# Patient Record
Sex: Female | Born: 1985 | Race: Asian | Hispanic: No | Marital: Married | Smoking: Never smoker
Health system: Southern US, Community
[De-identification: ages and names within clinical notes are randomized; demographics above are authoritative.]

## PROBLEM LIST (undated history)

## (undated) DIAGNOSIS — O9902 Anemia complicating childbirth: Secondary | ICD-10-CM

## (undated) DIAGNOSIS — Z98891 History of uterine scar from previous surgery: Secondary | ICD-10-CM

## (undated) HISTORY — DX: Anemia complicating childbirth: O99.02

## (undated) HISTORY — DX: History of uterine scar from previous surgery: Z98.891

---

## 2016-08-02 ENCOUNTER — Telehealth: Payer: Self-pay | Admitting: *Deleted

## 2016-08-02 NOTE — Telephone Encounter (Signed)
PreVisit Call attempted. Left VM requesting return call. 

## 2016-08-03 ENCOUNTER — Encounter: Payer: Self-pay | Admitting: Family Medicine

## 2016-08-03 ENCOUNTER — Ambulatory Visit (INDEPENDENT_AMBULATORY_CARE_PROVIDER_SITE_OTHER): Payer: Managed Care, Other (non HMO) | Admitting: Family Medicine

## 2016-08-03 VITALS — BP 98/64 | HR 99 | Temp 98.2°F | Ht 64.0 in | Wt 96.2 lb

## 2016-08-03 DIAGNOSIS — G479 Sleep disorder, unspecified: Secondary | ICD-10-CM

## 2016-08-03 DIAGNOSIS — R05 Cough: Secondary | ICD-10-CM

## 2016-08-03 DIAGNOSIS — R059 Cough, unspecified: Secondary | ICD-10-CM

## 2016-08-03 MED ORDER — AZITHROMYCIN 250 MG PO TABS: ORAL_TABLET | ORAL | 0 refills | Status: DC

## 2016-08-03 MED ORDER — BENZONATATE 100 MG PO CAPS: 100.0000 mg | ORAL_CAPSULE | Freq: Three times a day (TID) | ORAL | 1 refills | Status: DC | PRN

## 2016-08-03 NOTE — Progress Notes (Signed)
Pre visit review using our clinic review tool, if applicable. No additional management support is needed unless otherwise documented below in the visit note. 

## 2016-08-03 NOTE — Progress Notes (Signed)
Julie Gonzalez is a 31 y.o. female is here to [facility].   History of Present Illness:   Insurance claims handler, CMA, acting as scribe for Dr. Earlene Plater.  Cough  This is a new problem. The current episode started in the past 7 days. The problem has been gradually worsening. The problem occurs every few minutes. The cough is non-productive. Pertinent negatives include no chest pain, chills, fever, headaches, rash or shortness of breath. She has tried nothing for the symptoms.  Insomnia  Primary symptoms: fragmented sleep.  The current episode started more than one year. The problem occurs nightly. The problem is unchanged. How many beverages per day that contain caffeine: 0 - 1.  Nothing relieves the symptoms. Past treatments include nothing. Typical bedtime:  10-11 P.M..  How long after going to bed to you fall asleep: over an hour.   Duration of naps:  One to two hours.  PMH includes: family stress or anxiety. Prior diagnostic workup includes:  No prior workup.   Health Maintenance Due  Topic Date Due  . HIV Screening  10/02/1998  . TETANUS/TDAP  10/02/2002  . PAP SMEAR  10/01/2004   PMHx, SurgHx, SocialHx, Medications, and Allergies were reviewed in the Visit Navigator and updated as appropriate.   No past medical history on file.  No past surgical history on file.  No family history on file.  Social History  Substance Use Topics  . Smoking status: Never Smoker  . Smokeless tobacco: Never Used  . Alcohol use No    Current Medications and Allergies:   No current outpatient prescriptions on file.  No Known Allergies   Review of Systems:   Review of Systems  Constitutional: Negative for chills and fever.  HENT: Positive for congestion.   Respiratory: Positive for cough. Negative for shortness of breath.   Cardiovascular: Negative for chest pain and palpitations.  Gastrointestinal: Negative for abdominal pain, nausea and vomiting.  Genitourinary: Negative for frequency.    Musculoskeletal: Negative for back pain.  Skin: Negative for rash.  Neurological: Negative for loss of consciousness and headaches.  Psychiatric/Behavioral: The patient has insomnia.     Vitals:   Vitals:   08/03/16 0843  BP: 98/64  Pulse: 99  Temp: 98.2 F (36.8 C)  TempSrc: Oral  SpO2: 100%  Weight: 96 lb 3.2 oz (43.6 kg)  Height: 5\' 4"  (1.626 m)     Body mass index is 16.51 kg/m.   Physical Exam:   Physical Exam  Constitutional: She appears well-developed and well-nourished. No distress.  HENT:  Head: Normocephalic and atraumatic.  Right Ear: Tympanic membrane and external ear normal.  Left Ear: Tympanic membrane and external ear normal.  Nose: Rhinorrhea present. Right sinus exhibits no maxillary sinus tenderness and no frontal sinus tenderness. Left sinus exhibits no maxillary sinus tenderness and no frontal sinus tenderness.  Eyes: Pupils are equal, round, and reactive to light.  Neck: Neck supple.  Cardiovascular: Normal rate, regular rhythm, normal heart sounds and intact distal pulses.   Pulmonary/Chest: Breath sounds normal.  Abdominal: Soft.  Lymphadenopathy:    She has no cervical adenopathy.  Neurological: She is alert.  Skin: Skin is warm. Capillary refill takes less than 2 seconds.  Psychiatric: She has a normal mood and affect. Her behavior is normal. Judgment and thought content normal.  Nursing note and vitals reviewed.    Assessment and Plan:   Julie Gonzalez was seen today for establish care and cough.  Diagnoses and all orders for this visit:  Cough Comments: URI with mild bronchitis. No red flags.  Orders: -     azithromycin (ZITHROMAX) 250 MG tablet; 2 po day, then 1 po each day after until done. (SAFETY NET TO HOLD) -     benzonatate (TESSALON PERLES) 100 MG capsule; Take 1 capsule (100 mg total) by mouth 3 (three) times daily as needed for cough.  Sleep disorder Comments: Discussed sleep hygiene. OTC Melatonin trial x 4-6  weeks.  . Reviewed expectations re: course of current medical issues. . Discussed self-management of symptoms. . Outlined signs and symptoms indicating need for more acute intervention. . Patient verbalized understanding and all questions were answered. . See orders for this visit as documented in the electronic medical record. . Patient received an After Visit Summary.  Records requested if needed. I spent 30 minutes with this patient, greater than 50% was face-to-face time counseling regarding the above diagnoses.  CMA served as Neurosurgeonscribe during this visit. History, Physical, and Plan performed by medical provider. Documentation and orders reviewed and attested to. Helane RimaErica Leondro Coryell, D.O.  Helane RimaErica Jakiah Goree, D.O. [place] 08/03/2016

## 2017-01-18 ENCOUNTER — Ambulatory Visit (INDEPENDENT_AMBULATORY_CARE_PROVIDER_SITE_OTHER): Payer: Managed Care, Other (non HMO) | Admitting: Family Medicine

## 2017-01-18 ENCOUNTER — Encounter: Payer: Self-pay | Admitting: Family Medicine

## 2017-01-18 VITALS — BP 110/70 | HR 129 | Temp 100.0°F | Ht 64.0 in | Wt 96.8 lb

## 2017-01-18 DIAGNOSIS — R05 Cough: Secondary | ICD-10-CM | POA: Diagnosis not present

## 2017-01-18 DIAGNOSIS — R6889 Other general symptoms and signs: Secondary | ICD-10-CM

## 2017-01-18 DIAGNOSIS — N390 Urinary tract infection, site not specified: Secondary | ICD-10-CM | POA: Diagnosis not present

## 2017-01-18 DIAGNOSIS — R300 Dysuria: Secondary | ICD-10-CM

## 2017-01-18 DIAGNOSIS — R509 Fever, unspecified: Secondary | ICD-10-CM

## 2017-01-18 LAB — POCT URINALYSIS DIPSTICK
Bilirubin, UA: NEGATIVE
Blood, UA: NEGATIVE
Glucose, UA: NEGATIVE
Nitrite, UA: POSITIVE
PH UA: 6 (ref 5.0–8.0)
PROTEIN UA: NEGATIVE
Spec Grav, UA: 1.015 (ref 1.010–1.025)
UROBILINOGEN UA: 0.2 U/dL

## 2017-01-18 LAB — POC INFLUENZA A&B (BINAX/QUICKVUE)
INFLUENZA B, POC: NEGATIVE
Influenza A, POC: NEGATIVE

## 2017-01-18 MED ORDER — CEFTRIAXONE SODIUM 1 G IJ SOLR
1.0000 g | Freq: Once | INTRAMUSCULAR | Status: AC
  Administered 2017-01-18: 1 g via INTRAMUSCULAR

## 2017-01-18 MED ORDER — CIPROFLOXACIN HCL 500 MG PO TABS: 500.0000 mg | ORAL_TABLET | Freq: Two times a day (BID) | ORAL | 0 refills | Status: DC

## 2017-01-18 NOTE — Progress Notes (Signed)
    Subjective:  Julie Gonzalez is a 31 y.o. female who presents today with a chief complaint of fever.  HPI:  Fever Symptoms started 2 days ago with runny nose and cough. Yesterday started having subjective fevers, chills, and myalgias. Son had similar symptoms with the runny nose. Took some cough and cold medication which helped some with the myalgias. Some headaches.   Patient reports one episode of dysuria yesterday evening. On further questioning, patient also reports a history of frequent UTIs  ROS: Per HPI  PMH: Smoking history reviewed. Never smoker.   Objective:  Physical Exam: BP 110/70   Pulse (!) 129 Comment: Manual/1 full minute  Temp 100 F (37.8 C) (Oral)   Ht 5\' 4"  (1.626 m)   Wt 96 lb 12.8 oz (43.9 kg)   SpO2 95%   BMI 16.62 kg/m   Gen: NAD, resting comfortably, nontoxic appearing HEENT: TMs clear bilaterally, Mild submandibular LAD. Maxillary sinuses transilluminate.  CV: RRR with no murmurs appreciated Pulm: NWOB, CTAB with no crackles, wheezes, or rhonchi Back: No significant CVA tenderness  Results for orders placed or performed in visit on 01/18/17 (from the past 72 hour(s))  POCT urinalysis dipstick     Status: Abnormal   Collection Time: 01/18/17 11:19 AM  Result Value Ref Range   Color, UA Yellow    Clarity, UA Cloudy    Glucose, UA Negative    Bilirubin, UA Negative    Ketones, UA 2+     Comment: 40   Spec Grav, UA 1.015 1.010 - 1.025   Blood, UA Negative    pH, UA 6.0 5.0 - 8.0   Protein, UA Negative    Urobilinogen, UA 0.2 0.2 or 1.0 E.U./dL   Nitrite, UA Positive    Leukocytes, UA Small (1+) (A) Negative  POC Influenza A&B(BINAX/QUICKVUE)     Status: Normal   Collection Time: 01/18/17 11:20 AM  Result Value Ref Range   Influenza A, POC Negative Negative   Influenza B, POC Negative Negative    Assessment/Plan:  Flu-like symptoms Patient with viral syndrome including rhinorrhea, cough, subjective fevers, and myalgias. Rapid flu  here today negative. It is unclear how much of her systemic symptoms are due to viral URI vs UTI, however will treat for presumed UTI (see below). Will proceed with supportive care for her URI. Encouraged good PO hydration and tylenol and ibuprofen as needed.  UTI Patient with positive UA, dysuria, and some systemic symptoms. She is tachycardic here, but with normal BP. Will give 1 gram of ceftriaxone in the office and start a 5 day course of cipro 500mg  bid to cover for pyelonephritis. She overall appears well. Discussed need for adequate oral hydration. Discussed strict return precautions including worsening of fevers, myalgias, fatigue, and lethargy. Will send for urine culture.   Katina Degreealeb M. Jimmey RalphParker, MD 01/18/2017 11:52 AM

## 2017-01-18 NOTE — Patient Instructions (Addendum)
Your flu test today is negative.  It does look like you may have a urinary tract infection. Please take ciprofloxacin 500mg  twice daily for 5 days.   You can take ibuprofen 600mg  three times a day as needed and tylenol 1000mg  three times a day as needed. You can take both of these at the same time.  Make sure you are taking in enough fluids.   Let us know if you are worsening or if symptoms do not improve within the next few days.  Take care,  Dr Jimmey RalphParker

## 2017-01-21 LAB — URINE CULTURE
MICRO NUMBER:: 80973175
SPECIMEN QUALITY:: ADEQUATE

## 2017-01-24 ENCOUNTER — Telehealth: Payer: Self-pay | Admitting: Family Medicine

## 2017-01-24 ENCOUNTER — Other Ambulatory Visit: Payer: Self-pay | Admitting: Family Medicine

## 2017-01-24 MED ORDER — SULFAMETHOXAZOLE-TRIMETHOPRIM 800-160 MG PO TABS: 1.0000 | ORAL_TABLET | Freq: Two times a day (BID) | ORAL | 0 refills | Status: DC

## 2017-01-24 NOTE — Telephone Encounter (Signed)
Patient's husband returning call to discuss lab results. Call [phone] and ask for [place].

## 2017-01-24 NOTE — Telephone Encounter (Signed)
Pt's Husband is aware of results.

## 2017-02-22 ENCOUNTER — Ambulatory Visit (INDEPENDENT_AMBULATORY_CARE_PROVIDER_SITE_OTHER): Payer: Managed Care, Other (non HMO) | Admitting: Family Medicine

## 2017-02-22 VITALS — BP 98/66 | HR 99 | Temp 98.6°F | Wt 99.2 lb

## 2017-02-22 DIAGNOSIS — F5102 Adjustment insomnia: Secondary | ICD-10-CM | POA: Diagnosis not present

## 2017-02-22 MED ORDER — LORAZEPAM 0.5 MG PO TABS: 0.5000 mg | ORAL_TABLET | Freq: Every day | ORAL | 0 refills | Status: DC

## 2017-02-26 ENCOUNTER — Encounter: Payer: Self-pay | Admitting: Family Medicine

## 2017-02-26 DIAGNOSIS — F5102 Adjustment insomnia: Secondary | ICD-10-CM | POA: Insufficient documentation

## 2017-02-26 NOTE — Progress Notes (Signed)
   Julie Gonzalez is a 31 y.o. female here for an acute visit.  History of Present Illness:   HPI:  Patient complains of trouble sleeping. Symptoms began several weeks ago. Patient describes the following psychological symptoms: stress in the family. Patient denies change in hair texture, cold intolerance, constipation, excessive menstrual bleeding, exercise intolerance, fever, GI blood loss, significant change in weight, symptoms of arthritis, unusual rashes and witnessed or suspected sleep apnea. Symptoms have gradually worsened. Symptom severity: struggles to carry out day to day responsibilities.. Previous visits for this problem: none. She feels that she practices good sleep hygiene.  PMHx, SurgHx, SocialHx, Medications, and Allergies were reviewed in the Visit Navigator and updated as appropriate.  Current Medications:   . None  No Known Allergies   Review of Systems:   Pertinent items are noted in the HPI. Otherwise, ROS is negative.  Vitals:   Vitals:   02/22/17 1043  BP: 98/66  Pulse: 99  Temp: 98.6 F (37 C)  TempSrc: Oral  SpO2: 97%  Weight: 99 lb 3.2 oz (45 kg)     Body mass index is 17.03 kg/m.   Physical Exam:   Physical Exam  Constitutional: She appears well-developed and well-nourished. No distress.  HENT:  Head: Normocephalic and atraumatic.  Eyes: Pupils are equal, round, and reactive to light. EOM are normal.  Neck: Normal range of motion. Neck supple.  Cardiovascular: Normal rate, regular rhythm, normal heart sounds and intact distal pulses.   Pulmonary/Chest: Effort normal.  Abdominal: Soft.  Skin: Skin is warm.  Psychiatric: She has a normal mood and affect. Her behavior is normal. Judgment and thought content normal.  Tired-appearing.   Nursing note and vitals reviewed.   Assessment and Plan:   Diagnoses and all orders for this visit:  Adjustment insomnia Comments: Discussed sleep hygiene measures including regular sleep schedule,  optimal sleep environment, and relaxing presleep rituals. Avoid daytime naps. Avoid caffeine after noon. Avoid excess alcohol. Avoid tobacco. Recommended daily exercise. Short course of benzodiazepine therapy. Follow up in 3 months or sooner if not improving. Orders: -     LORazepam (ATIVAN) 0.5 MG tablet; Take 1 tablet (0.5 mg total) by mouth at bedtime.  . Reviewed expectations re: course of current medical issues. . Discussed self-management of symptoms. . Outlined signs and symptoms indicating need for more acute intervention. . Patient verbalized understanding and all questions were answered. Marland Kitchen Health Maintenance issues including appropriate healthy diet, exercise, and smoking avoidance were discussed with patient. . See orders for this visit as documented in the electronic medical record. . Patient received an After Visit Summary.   Helane Rima, DO [place], [facility] 02/26/2017

## 2017-02-26 NOTE — Patient Instructions (Signed)
15 Tips to a Better Night's Sleep   Practice "good sleep hygiene". Here are some tips for you to try:   1. No reading or watching TV in bed. These are waking activities.  2. Go to bed when you're sleepy-tired, not when it's time to go to bed by habit.  3. Wind down during the second half of the evening before bedtime. Don't get involved in any kind of anxiety-provoking activities of thought 90 minutes before retiring to bed.  4. Do some breathing exercises or try to relax major muscle groups. Start at the toes and work up the body all the way to the forehead.  5. Your bed is for sleeping, so if you cannot sleep after 15-20 minutes in bed, get up and do something relaxing.  6. Have your room cool instead of warm.  7. Don't count sheep--counting is stimulating.  8. Exercise in the afternoon or early evening, but no later than three hours before bedtime.  9. Don't overeat or eat two to three hours before bedtime.  10. Try not to nap during the day.  11. If you awake in the middle of the night and can't get back to sleep within 30 minutes, get up and do something relaxing (no TV or reading anything stimulating).  12. Have no caffeine, alcohol or cigarettes two to three hours before retiring to bed.  13. If you have disturbing dreams or nightmares repeatedly, try to add an ending you enjoy or like better.  14. Keep a sleep journal. Thirty minutes before you go to bed, write down your concerns and hopes. It frees up your sleep from processing your dilemmas.  15. Listen to calming music or recorded sounds (ocean, forest, birds, crickets, brook) before bed.   If sleep problems persist, contact your physician or mental health professional. Let them know what is happening in your life. Your problem may have either organic or psychological contributors. Sleep disorders are considered chronic if they persist over more than one month.   

## 2017-03-14 ENCOUNTER — Encounter: Payer: Managed Care, Other (non HMO) | Admitting: Family Medicine

## 2017-03-17 ENCOUNTER — Encounter: Payer: Self-pay | Admitting: Family Medicine

## 2017-03-17 ENCOUNTER — Ambulatory Visit (INDEPENDENT_AMBULATORY_CARE_PROVIDER_SITE_OTHER): Payer: Managed Care, Other (non HMO) | Admitting: Family Medicine

## 2017-03-17 ENCOUNTER — Other Ambulatory Visit (HOSPITAL_COMMUNITY)
Admission: RE | Admit: 2017-03-17 | Discharge: 2017-03-17 | Disposition: A | Discharge status: Home / Self Care | Payer: Managed Care, Other (non HMO) | Source: Ambulatory Visit | Attending: Family Medicine | Admitting: Family Medicine

## 2017-03-17 VITALS — BP 110/70 | HR 88 | Temp 98.4°F | Ht 64.0 in | Wt 100.4 lb

## 2017-03-17 DIAGNOSIS — D500 Iron deficiency anemia secondary to blood loss (chronic): Secondary | ICD-10-CM

## 2017-03-17 DIAGNOSIS — Z13220 Encounter for screening for lipoid disorders: Secondary | ICD-10-CM

## 2017-03-17 DIAGNOSIS — Z124 Encounter for screening for malignant neoplasm of cervix: Secondary | ICD-10-CM | POA: Diagnosis not present

## 2017-03-17 DIAGNOSIS — R7989 Other specified abnormal findings of blood chemistry: Secondary | ICD-10-CM | POA: Diagnosis not present

## 2017-03-17 DIAGNOSIS — R5383 Other fatigue: Secondary | ICD-10-CM | POA: Diagnosis not present

## 2017-03-17 DIAGNOSIS — Z0000 Encounter for general adult medical examination without abnormal findings: Secondary | ICD-10-CM | POA: Diagnosis not present

## 2017-03-17 NOTE — Progress Notes (Signed)
Subjective:    Mahayla Haddaway is a 31 y.o. female and is here for a comprehensive physical exam.  Pertinent Gynecological History: No LMP recorded.  Health Maintenance Due  Topic Date Due  . HIV Screening  10/02/1998  . TETANUS/TDAP  10/02/2002  . PAP SMEAR  10/01/2004   PMHx, SurgHx, SocialHx, Medications, and Allergies were reviewed in the Visit Navigator and updated as appropriate.   Past Medical History:  Diagnosis Date  . H/O: cesarean section    History reviewed. No pertinent surgical history.  Family History  Problem Relation Age of Onset  . Diabetes Father   . Heart disease Paternal Grandmother    Social History  Substance Use Topics  . Smoking status: Never Smoker  . Smokeless tobacco: Never Used  . Alcohol use No    Review of Systems:   Pertinent items are noted in the HPI. Otherwise, ROS is negative.  Objective:   BP 110/70   Pulse 88   Temp 98.4 F (36.9 C) (Oral)   Ht 5\' 4"  (1.626 m)   Wt 100 lb 6.4 oz (45.5 kg)   SpO2 100%   BMI 17.23 kg/m   Wt Readings from Last 3 Encounters:  03/17/17 100 lb 6.4 oz (45.5 kg)  02/22/17 99 lb 3.2 oz (45 kg)  01/18/17 96 lb 12.8 oz (43.9 kg)     Ht Readings from Last 3 Encounters:  03/17/17 5\' 4"  (1.626 m)  01/18/17 5\' 4"  (1.626 m)  08/03/16 5\' 4"  (1.626 m)   General appearance: alert, cooperative and appears stated age. Head: normocephalic, without obvious abnormality, atraumatic. Neck: no adenopathy, supple, symmetrical, trachea midline; thyroid not enlarged, symmetric, no tenderness/mass/nodules. Lungs: clear to auscultation bilaterally. Heart: regular rate and rhythm Abdomen: soft, non-tender; no masses,  no organomegaly. Extremities: extremities normal, atraumatic, no cyanosis or edema. Skin: skin color, texture, turgor normal, no rashes or lesions. Lymph: cervical, supraclavicular, and axillary nodes normal; no abnormal inguinal nodes palpated. Neurologic: grossly normal.  Pelvic:  External  genitalia: no lesions.              Urethra: normal appearing urethra with no masses, tenderness or lesions.              Bartholins and Skenes: normal.               Vagina: normal appearing vagina with normal color and discharge, no lesions.              Cervix: normal appearance.              Pap and high risk HPV testing done: Yes.  .        Bimanual Exam:   Uterus: uterus is normal size, shape, consistency and nontender.                                      Adnexa: normal adnexa in size, nontender and no masses.                                   Assessment/Plan:   Diagnoses and all orders for this visit:  Routine physical examination -     CBC with Differential/Platelet -     Comprehensive metabolic panel  Pap smear for cervical cancer screening -     Cytology - PAP  Screening for  lipid disorders -     Lipid panel  Fatigue, unspecified type -     TSH -     Vitamin B12  Low vitamin D level -     VITAMIN D 25 Hydroxy (Vit-D Deficiency, Fractures)   Patient Counseling:   [x]     Nutrition: Stressed importance of moderation in sodium/caffeine intake, saturated fat and cholesterol, caloric balance, sufficient intake of fresh fruits, vegetables, fiber, calcium, iron, and 1 mg of folate supplement per day (for females capable of pregnancy).   [x]      Stressed the importance of regular exercise.    [x]     Substance Abuse: Discussed cessation/primary prevention of tobacco, alcohol, or other drug use; driving or other dangerous activities under the influence; availability of treatment for abuse.    [x]      Injury prevention: Discussed safety belts, safety helmets, smoke detector, smoking near bedding or upholstery.    [x]      Sexuality: Discussed sexually transmitted diseases, partner selection, use of condoms, avoidance of unintended pregnancy  and contraceptive alternatives.    [x]     Dental health: Discussed importance of regular tooth brushing, flossing, and dental  visits.   [x]      Health maintenance and immunizations reviewed. Please refer to Health maintenance section.   Helane RimaErica Grisela Mesch, DO [place] [facility]

## 2017-03-18 LAB — CBC WITH DIFFERENTIAL/PLATELET
Basophils Absolute: 31 cells/uL (ref 0–200)
Basophils Relative: 0.5 %
Eosinophils Absolute: 118 cells/uL (ref 15–500)
Eosinophils Relative: 1.9 %
HCT: 33.7 % — ABNORMAL LOW (ref 35.0–45.0)
Hemoglobin: 11.3 g/dL — ABNORMAL LOW (ref 11.7–15.5)
Lymphs Abs: 2108 cells/uL (ref 850–3900)
MCH: 27.3 pg (ref 27.0–33.0)
MCHC: 33.5 g/dL (ref 32.0–36.0)
MCV: 81.4 fL (ref 80.0–100.0)
MPV: 10.9 fL (ref 7.5–12.5)
Monocytes Relative: 9.9 %
Neutro Abs: 3329 cells/uL (ref 1500–7800)
Neutrophils Relative %: 53.7 %
Platelets: 189 10*3/uL (ref 140–400)
RBC: 4.14 10*6/uL (ref 3.80–5.10)
RDW: 12.8 % (ref 11.0–15.0)
Total Lymphocyte: 34 %
WBC mixed population: 614 cells/uL (ref 200–950)
WBC: 6.2 10*3/uL (ref 3.8–10.8)

## 2017-03-18 LAB — COMPREHENSIVE METABOLIC PANEL
AG Ratio: 1.4 (calc) (ref 1.0–2.5)
ALT: 10 U/L (ref 6–29)
AST: 14 U/L (ref 10–30)
Albumin: 4 g/dL (ref 3.6–5.1)
Alkaline phosphatase (APISO): 33 U/L (ref 33–115)
BUN: 9 mg/dL (ref 7–25)
CO2: 22 mmol/L (ref 20–32)
Calcium: 8.4 mg/dL — ABNORMAL LOW (ref 8.6–10.2)
Chloride: 108 mmol/L (ref 98–110)
Creat: 0.58 mg/dL (ref 0.50–1.10)
Globulin: 2.9 g/dL (calc) (ref 1.9–3.7)
Glucose, Bld: 74 mg/dL (ref 65–99)
Potassium: 4 mmol/L (ref 3.5–5.3)
Sodium: 140 mmol/L (ref 135–146)
Total Bilirubin: 0.3 mg/dL (ref 0.2–1.2)
Total Protein: 6.9 g/dL (ref 6.1–8.1)

## 2017-03-18 LAB — LIPID PANEL
Cholesterol: 168 mg/dL (ref <200)
HDL: 38 mg/dL — ABNORMAL LOW (ref >50)
LDL Cholesterol (Calc): 108 mg/dL (calc) — ABNORMAL HIGH
Non-HDL Cholesterol (Calc): 130 mg/dL (calc) — ABNORMAL HIGH (ref <130)
Total CHOL/HDL Ratio: 4.4 (calc) (ref <5.0)
Triglycerides: 112 mg/dL (ref <150)

## 2017-03-18 LAB — VITAMIN D 25 HYDROXY (VIT D DEFICIENCY, FRACTURES): Vit D, 25-Hydroxy: 10 ng/mL — ABNORMAL LOW (ref 30–100)

## 2017-03-18 LAB — TSH: TSH: 2.49 mIU/L

## 2017-03-18 LAB — VITAMIN B12: Vitamin B-12: 314 pg/mL (ref 200–1100)

## 2017-03-18 MED ORDER — CHOLECALCIFEROL 1.25 MG (50000 UT) PO TABS: ORAL_TABLET | ORAL | 0 refills | Status: DC

## 2017-03-18 MED ORDER — FERROUS SULFATE 325 (65 FE) MG PO TABS: 325.0000 mg | ORAL_TABLET | Freq: Two times a day (BID) | ORAL | 3 refills | Status: DC

## 2017-03-18 NOTE — Addendum Note (Signed)
Addended by: Helane RimaWALLACE, Corinthian Mizrahi R on: 03/18/2017 07:20 PM   Modules accepted: Orders

## 2017-03-21 LAB — CYTOLOGY - PAP: Diagnosis: NEGATIVE

## 2017-06-11 ENCOUNTER — Other Ambulatory Visit: Payer: Self-pay | Admitting: Family Medicine

## 2017-06-11 DIAGNOSIS — R7989 Other specified abnormal findings of blood chemistry: Secondary | ICD-10-CM

## 2017-08-15 LAB — OB RESULTS CONSOLE HIV ANTIBODY (ROUTINE TESTING): HIV: NONREACTIVE

## 2017-08-15 LAB — OB RESULTS CONSOLE RPR: RPR: NONREACTIVE

## 2017-08-15 LAB — OB RESULTS CONSOLE HEPATITIS B SURFACE ANTIGEN: Hepatitis B Surface Ag: NEGATIVE

## 2017-08-15 LAB — OB RESULTS CONSOLE RUBELLA ANTIBODY, IGM: Rubella: IMMUNE

## 2018-01-09 ENCOUNTER — Encounter (HOSPITAL_COMMUNITY): Payer: Self-pay

## 2018-01-09 ENCOUNTER — Inpatient Hospital Stay (HOSPITAL_COMMUNITY)
Admission: AD | Admit: 2018-01-09 | Discharge: 2018-01-13 | DRG: 788 | Disposition: A | Discharge status: Home / Self Care | Payer: Managed Care, Other (non HMO) | Attending: Obstetrics & Gynecology | Admitting: Obstetrics & Gynecology

## 2018-01-09 ENCOUNTER — Encounter (HOSPITAL_COMMUNITY)
Admission: AD | Disposition: A | Discharge status: Home / Self Care | Payer: Self-pay | Source: Home / Self Care | Attending: Obstetrics & Gynecology

## 2018-01-09 ENCOUNTER — Other Ambulatory Visit: Payer: Self-pay

## 2018-01-09 ENCOUNTER — Inpatient Hospital Stay (HOSPITAL_COMMUNITY)
Admit: 2018-01-09 | Discharge: 2018-01-09 | Disposition: A | Discharge status: Home / Self Care | Payer: Managed Care, Other (non HMO)

## 2018-01-09 ENCOUNTER — Inpatient Hospital Stay (HOSPITAL_BASED_OUTPATIENT_CLINIC_OR_DEPARTMENT_OTHER): Payer: Managed Care, Other (non HMO)

## 2018-01-09 DIAGNOSIS — O26843 Uterine size-date discrepancy, third trimester: Secondary | ICD-10-CM

## 2018-01-09 DIAGNOSIS — O9902 Anemia complicating childbirth: Secondary | ICD-10-CM | POA: Diagnosis present

## 2018-01-09 DIAGNOSIS — O34211 Maternal care for low transverse scar from previous cesarean delivery: Secondary | ICD-10-CM | POA: Diagnosis present

## 2018-01-09 DIAGNOSIS — Z3A35 35 weeks gestation of pregnancy: Secondary | ICD-10-CM | POA: Diagnosis not present

## 2018-01-09 DIAGNOSIS — D649 Anemia, unspecified: Secondary | ICD-10-CM | POA: Diagnosis present

## 2018-01-09 DIAGNOSIS — O9989 Other specified diseases and conditions complicating pregnancy, childbirth and the puerperium: Secondary | ICD-10-CM | POA: Diagnosis present

## 2018-01-09 DIAGNOSIS — O4290 Premature rupture of membranes, unspecified as to length of time between rupture and onset of labor, unspecified weeks of gestation: Secondary | ICD-10-CM | POA: Diagnosis present

## 2018-01-09 DIAGNOSIS — O34219 Maternal care for unspecified type scar from previous cesarean delivery: Secondary | ICD-10-CM

## 2018-01-09 DIAGNOSIS — R000 Tachycardia, unspecified: Secondary | ICD-10-CM | POA: Diagnosis present

## 2018-01-09 DIAGNOSIS — Z98891 History of uterine scar from previous surgery: Secondary | ICD-10-CM

## 2018-01-09 DIAGNOSIS — O42913 Preterm premature rupture of membranes, unspecified as to length of time between rupture and onset of labor, third trimester: Principal | ICD-10-CM | POA: Diagnosis present

## 2018-01-09 LAB — CBC
HCT: 34.2 % — ABNORMAL LOW (ref 36.0–46.0)
Hemoglobin: 11.5 g/dL — ABNORMAL LOW (ref 12.0–15.0)
MCH: 30.2 pg (ref 26.0–34.0)
MCHC: 33.6 g/dL (ref 30.0–36.0)
MCV: 89.8 fL (ref 78.0–100.0)
PLATELETS: 154 10*3/uL (ref 150–400)
RBC: 3.81 MIL/uL — AB (ref 3.87–5.11)
RDW: 15 % (ref 11.5–15.5)
WBC: 8.4 10*3/uL (ref 4.0–10.5)

## 2018-01-09 LAB — TYPE AND SCREEN
ABO/RH(D): O POS
Antibody Screen: NEGATIVE

## 2018-01-09 LAB — RPR: RPR: NONREACTIVE

## 2018-01-09 LAB — ABO/RH: ABO/RH(D): O POS

## 2018-01-09 LAB — AMNISURE RUPTURE OF MEMBRANE (ROM) NOT AT ARMC: AMNISURE: POSITIVE

## 2018-01-09 IMAGING — US US MFM OB COMP +14 WKS
1 series · 14 of 28 positions shown · non-contrast
Comparison: none

[Series 1: us mfm ob comp +14 wks · 14 of 95 slices shown]
[im 4/95]
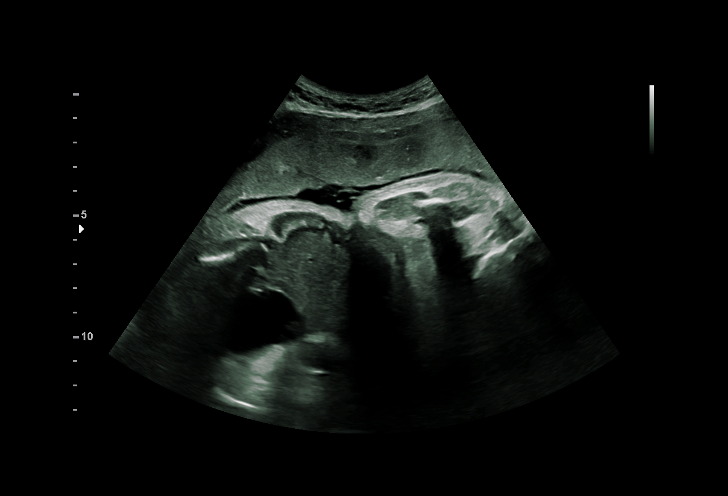
[im 11/95]
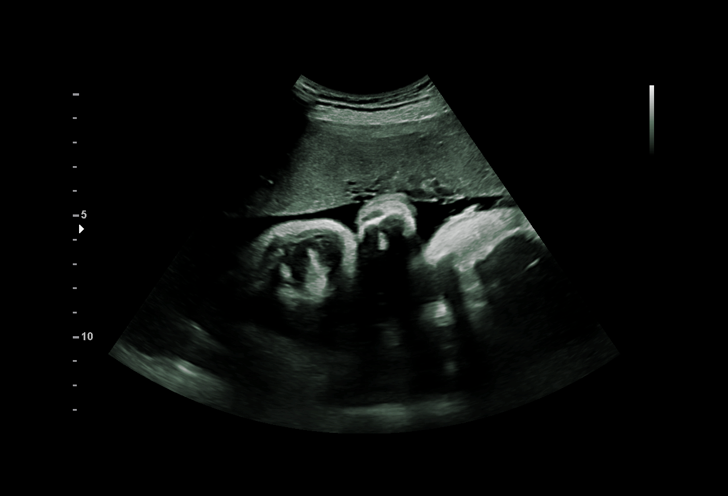
[im 18/95]
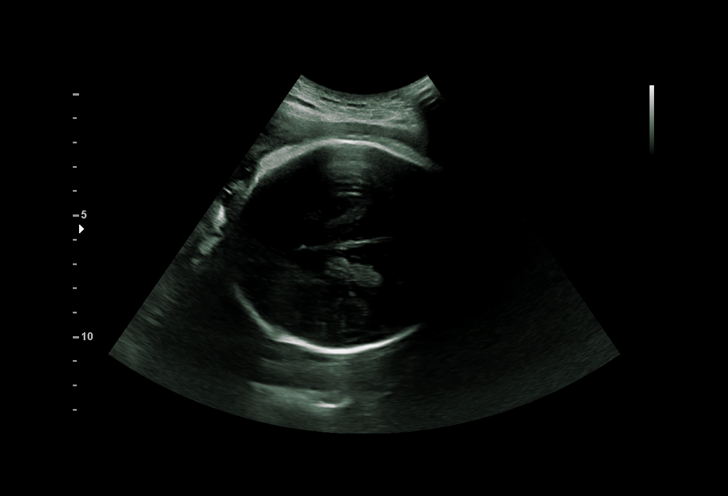
[im 25/95]
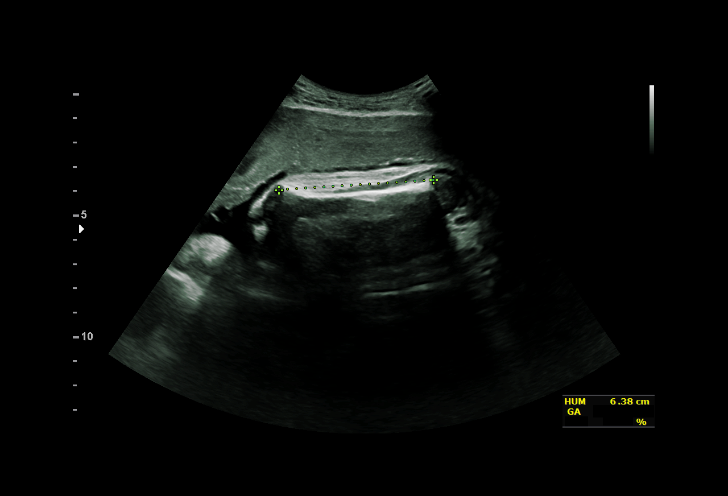
[im 32/95]
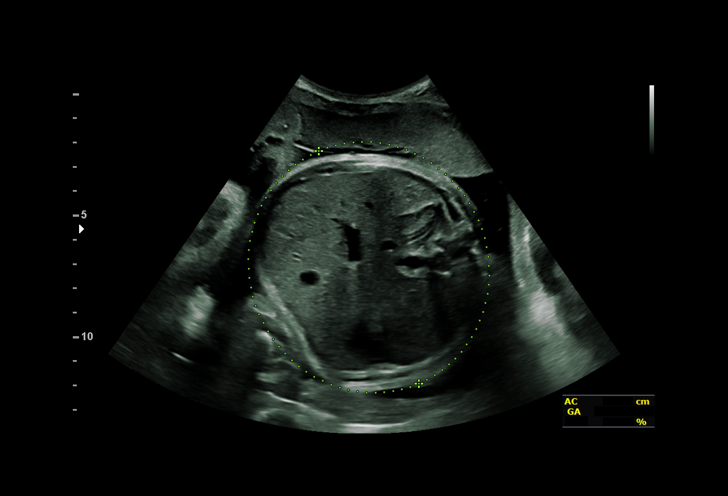
[im 39/95]
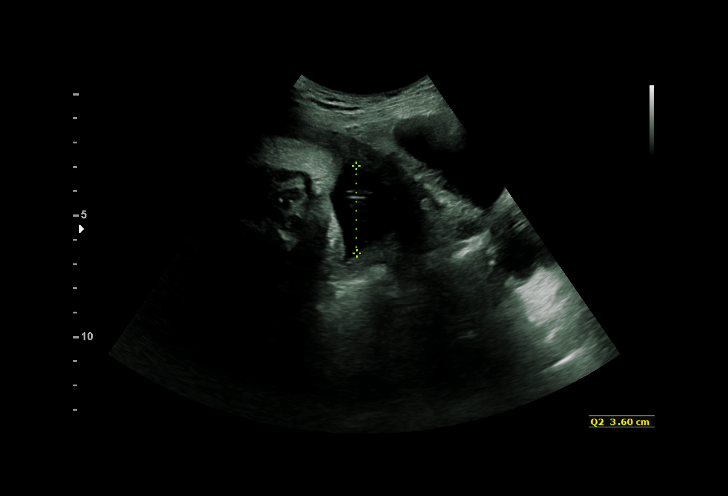
[im 46/95]
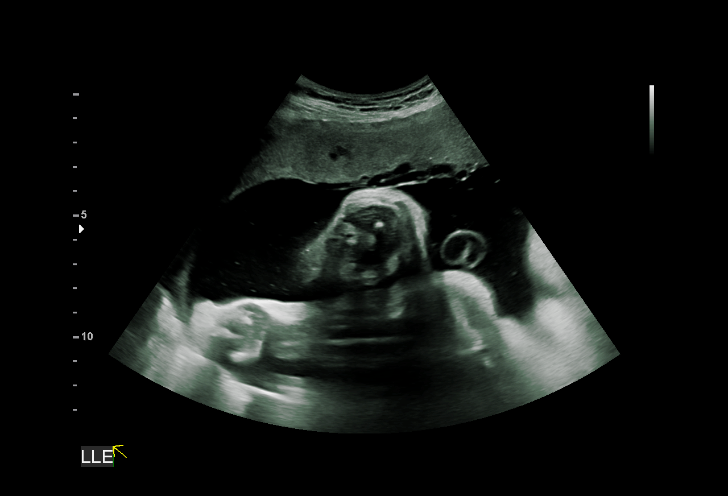
[im 53/95]
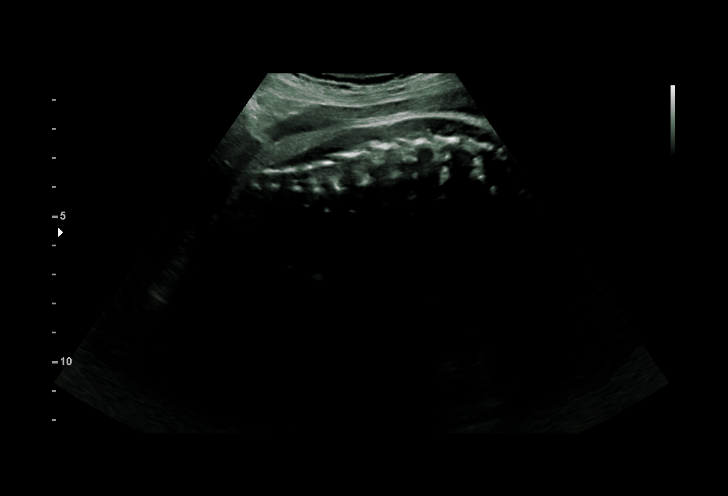
[im 60/95]
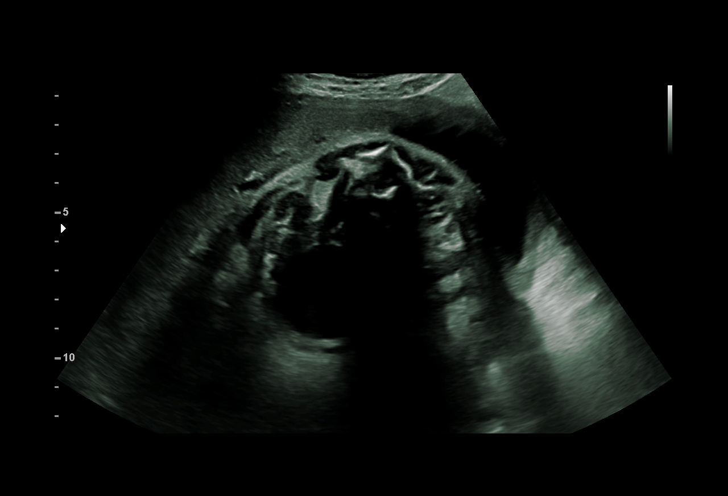
[im 67/95]
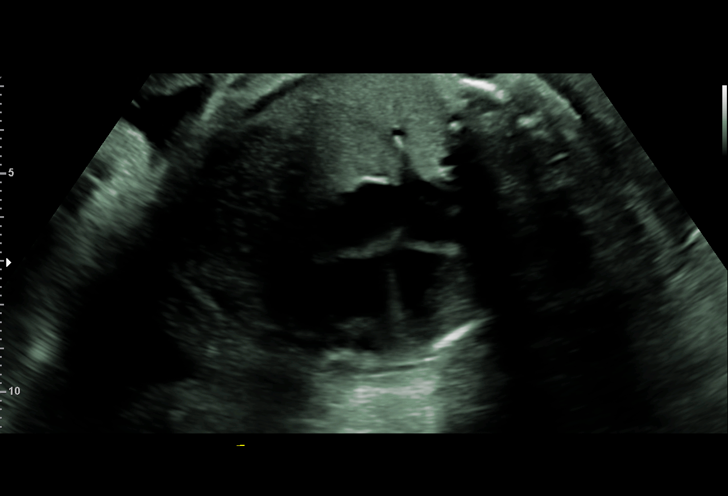
[im 74/95]
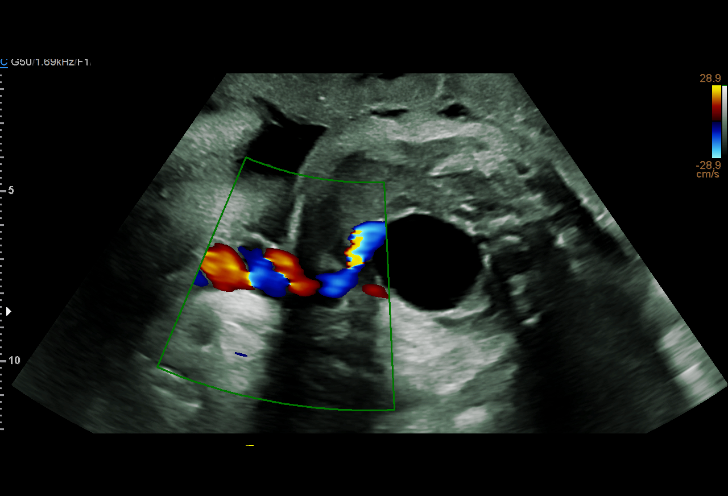
[im 81/95]
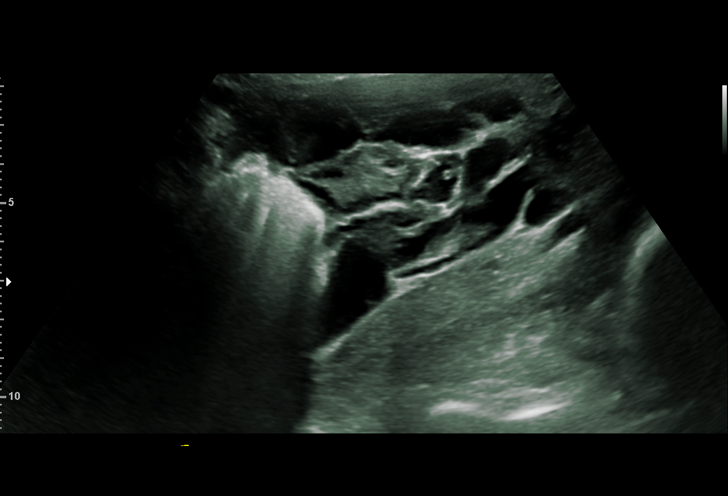
[im 88/95]
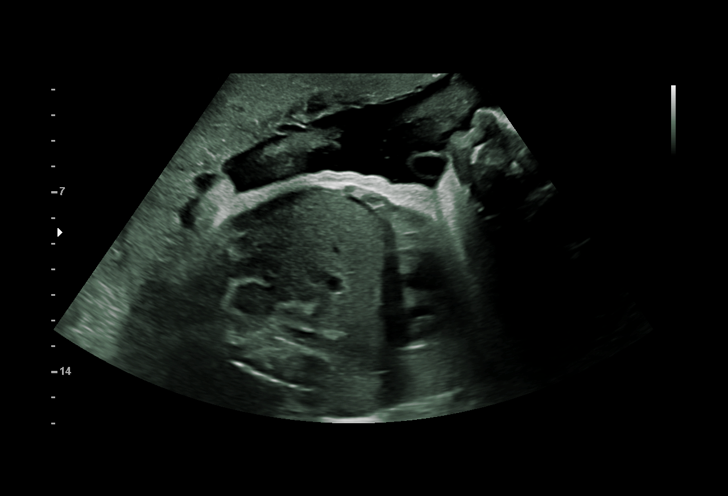
[im 95/95]
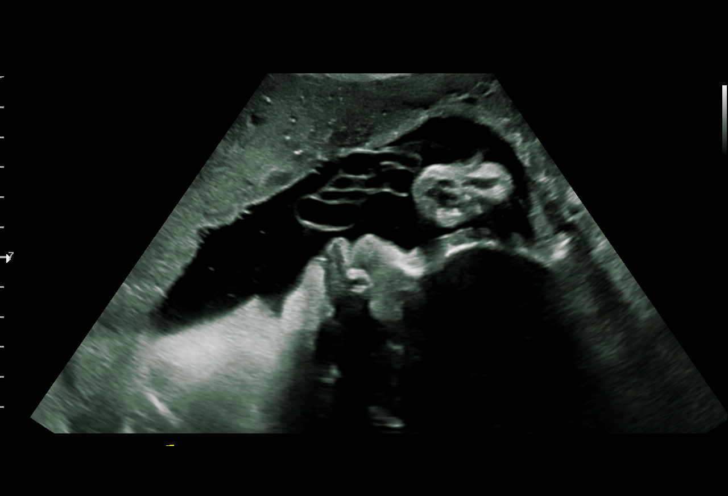

[14 of 28 positions shown; findings below may reference images not displayed]

Attending:        JUMPER      Address:          [HOSPITAL]
[facility]
MAU/Triage

J[facility]

Indications

Premature rupture of membranes - leaking       [YL]
fluid
History of cesarean delivery, currently        [YL]
pregnant
35 weeks gestation of pregnancy
Uterine size-date discrepancy, third trimester [YL]
Fetal Evaluation

Num Of Fetuses:         1
Fetal Heart Rate(bpm):  137
Cardiac Activity:       Observed
Presentation:           Cephalic
Placenta:               Anterior

Amniotic Fluid
AFI FV:      Within normal limits

AFI Sum(cm)     %Tile       Largest Pocket(cm)
19.21           72

RUQ(cm)       RLQ(cm)       LUQ(cm)        LLQ(cm)
4.02
Biometry

BPD:      86.3  mm     G. Age:  34w 6d         33  %    CI:        71.53   %    70 - 86
FL/HC:      22.0   %    20.1 -
HC:      324.9  mm     G. Age:  36w 6d         48  %    HC/AC:      1.03        0.93 -
AC:      316.2  mm     G. Age:  35w 4d         58  %    FL/BPD:     82.7   %    71 - 87
FL:       71.4  mm     G. Age:  36w 4d         70  %    FL/AC:      22.6   %    20 - 24
HUM:      63.4  mm     G. Age:  36w 6d         87  %
CER:        46  mm     G. Age:  N/A            90  %

LV:        6.2  mm

Est. FW:    [YL]  gm      6 lb 2 oz     70  %
OB History

Gravidity:    2         Term:   1
Living:       1
Gestational Age

Clinical EDD:  35w 4d                                        EDD:   [DATE]
U/S Today:     36w 0d                                        EDD:   [DATE]
Best:          35w 4d     Det. By:  Clinical EDD             EDD:   [DATE]
Anatomy

Cranium:               Appears normal         Aortic Arch:            Not well visualized
Cavum:                 Not well visualized    Ductal Arch:            Appears normal
Ventricles:            Appears normal         Diaphragm:              Appears normal
Choroid Plexus:        Appears normal         Stomach:                Appears normal, left
sided
Cerebellum:            Appears normal         Abdomen:                Appears normal
Posterior Fossa:       Appears normal         Abdominal Wall:         Appears nml (cord
insert, abd wall)
Nuchal Fold:           Not applicable (>20    Cord Vessels:           Appears normal (3
wks GA)                                        vessel cord)
Face:                  Appears normal         Kidneys:                Appear normal
(orbits and profile)
Lips:                  Appears normal         Bladder:                Appears normal
Thoracic:              Appears normal         Spine:                  Appears normal
Heart:                 Appears normal         Upper Extremities:      Appears normal
(4CH, axis, and
situs)
RVOT:                  Appears normal         Lower Extremities:      Appears normal
LVOT:                  Appears normal

Other:  Male gender. Technically difficult due to advanced GA and fetal
position.
Cervix Uterus Adnexa

Cervix
Not visualized (advanced GA >[YL])

Left Ovary
Not visualized.

Right Ovary
Within normal limits.
Comments

U/S images reviewed.  Appropriate fetal growth is noted.  No
fetal abnormalities are seen.
Recommendations: 1) Follow-up as clinically indicated
Recommendations

Follow-up as clinically indicated

## 2018-01-09 SURGERY — Surgical Case
Anesthesia: Regional

## 2018-01-09 MED ORDER — AMOXICILLIN 500 MG PO CAPS
500.0000 mg | ORAL_CAPSULE | Freq: Three times a day (TID) | ORAL | Status: DC
  Filled 2018-01-09 (×2): qty 1

## 2018-01-09 MED ORDER — AZITHROMYCIN 500 MG PO TABS
500.0000 mg | ORAL_TABLET | Freq: Every day | ORAL | Status: DC
  Filled 2018-01-09: qty 1

## 2018-01-09 MED ORDER — ZOLPIDEM TARTRATE 5 MG PO TABS: 5.0000 mg | ORAL_TABLET | Freq: Every evening | ORAL | Status: DC | PRN

## 2018-01-09 MED ORDER — PRENATAL MULTIVITAMIN CH
1.0000 | ORAL_TABLET | Freq: Every day | ORAL | Status: DC
  Administered 2018-01-09: 1 via ORAL
  Filled 2018-01-09 (×2): qty 1

## 2018-01-09 MED ORDER — NIFEDIPINE 10 MG PO CAPS
10.0000 mg | ORAL_CAPSULE | Freq: Four times a day (QID) | ORAL | Status: DC
  Administered 2018-01-09 (×3): 10 mg via ORAL
  Filled 2018-01-09 (×6): qty 1

## 2018-01-09 MED ORDER — SODIUM CHLORIDE 0.9 % IV SOLN
500.0000 mg | INTRAVENOUS | Status: AC
  Administered 2018-01-09 – 2018-01-10 (×2): 500 mg via INTRAVENOUS
  Filled 2018-01-09 (×2): qty 500

## 2018-01-09 MED ORDER — SODIUM CHLORIDE 0.9 % IV SOLN
2.0000 g | Freq: Four times a day (QID) | INTRAVENOUS | Status: DC
  Administered 2018-01-09 – 2018-01-10 (×7): 2 g via INTRAVENOUS
  Filled 2018-01-09: qty 2
  Filled 2018-01-09: qty 2000
  Filled 2018-01-09: qty 2
  Filled 2018-01-09: qty 2000
  Filled 2018-01-09: qty 2
  Filled 2018-01-09: qty 2000
  Filled 2018-01-09 (×2): qty 2

## 2018-01-09 MED ORDER — NIFEDIPINE 10 MG PO CAPS
20.0000 mg | ORAL_CAPSULE | Freq: Once | ORAL | Status: AC
  Administered 2018-01-09: 20 mg via ORAL
  Filled 2018-01-09: qty 2

## 2018-01-09 MED ORDER — BETAMETHASONE SOD PHOS & ACET 6 (3-3) MG/ML IJ SUSP
12.0000 mg | INTRAMUSCULAR | Status: AC
  Administered 2018-01-09 – 2018-01-10 (×2): 12 mg via INTRAMUSCULAR
  Filled 2018-01-09 (×2): qty 2

## 2018-01-09 MED ORDER — ACETAMINOPHEN 325 MG PO TABS: 650.0000 mg | ORAL_TABLET | ORAL | Status: DC | PRN

## 2018-01-09 MED ORDER — DOCUSATE SODIUM 100 MG PO CAPS
100.0000 mg | ORAL_CAPSULE | Freq: Every day | ORAL | Status: DC
  Administered 2018-01-09 – 2018-01-10 (×2): 100 mg via ORAL
  Filled 2018-01-09 (×3): qty 1

## 2018-01-09 MED ORDER — LACTATED RINGERS IV SOLN
INTRAVENOUS | Status: DC
  Administered 2018-01-09 – 2018-01-10 (×3): via INTRAVENOUS

## 2018-01-09 MED ORDER — CALCIUM CARBONATE ANTACID 500 MG PO CHEW: 2.0000 | CHEWABLE_TABLET | ORAL | Status: DC | PRN

## 2018-01-09 NOTE — MAU Note (Signed)
Pt here with c/o rupture of membranes about an hour ago. Denies any problems with the pregnancy. Reports fetal movement.

## 2018-01-09 NOTE — Progress Notes (Addendum)
Patient ID: Julie Gonzalez, female   DOB: 07-26-1985, 32 y.o.   MRN: [id number]  Patient seen and was d/w Dr Cherly Hensenousins this AM.  [name], clear fluid, well dated 35.4 weeks, prior 37 wk C/s for breech in labor. [facility] and wants RC[facility]s when appropriate. No s/s of labor/ chorioamnionitis/ abruption. FHT- catergory I. VS stable, uterus non tender.  Plan repeat C/section tomorrow, will be >36 hrs from 1st BTMZ dose, try to proceed before infection and before labor but intervene sooner for maternal/ fetal reasons.  Sono- 6'2" at 72%, AFI normal. Vx. Placenta anterior but not low.  Will cont Amp/Azithro and complete 2nd BTMZ dose. Change monitoring to 1 hr every shift and continuous if pain/ bleeding.  NPO at 8 am tomorrow for RC/s at 5 pm on 01/10/18.  Reviewed TL options, not sure. Will plan PP Paragard IUI and interval sterilization in few yrs   Risks/complications of surgery reviewed incl infection, bleeding, damage to internal organs including bladder, bowels, ureters, blood vessels, other risks from anesthesia, VTE and delayed complications of any surgery, complications in future surgery reviewed. Also discussed neonatal complications incl difficult delivery, laceration, vacuum assistance, TTN etc. Pt understands and agrees, all concerns addressed.    V.Zakry Caso, MD

## 2018-01-09 NOTE — H&P (Signed)
Julie Gonzalez is a 32 y.o. female presenting @  7735 4/7 weeks with SROM clear fluid. Previous LTCS for breech. Per note, single layer closure of C/S with desire for TOLAC. (+) FM  OB History    Gravida  2   Para  1   Term  1   Preterm      AB      Living        SAB      TAB      Ectopic      Multiple      Live Births             Past Medical History:  Diagnosis Date  . H/O: cesarean section   . Medical history non-contributory    Past Surgical History:  Procedure Laterality Date  . CESAREAN SECTION     Family History: family history includes Diabetes in her father; Heart disease in her paternal grandmother. Social History:  reports that she has never smoked. She has never used smokeless tobacco. She reports that she does not drink alcohol or use drugs.     Maternal Diabetes: No Genetic Screening: Abnormal:  Results: Elevated risk of Trisomy 21 nl panorama Maternal Ultrasounds/Referrals: Normal Fetal Ultrasounds or other Referrals:  None Maternal Substance Abuse:  No Significant Maternal Medications:  None Significant Maternal Lab Results:  None Other Comments:  None  Review of Systems  All other systems reviewed and are negative.  History Dilation: Closed Effacement (%): Thick Exam by:: Dr. Cherly Hensenousins Blood pressure 103/69, pulse 93, temperature 98.5 F (36.9 C), temperature source Oral, resp. rate 18, height 5\' 4"  (1.626 m), weight 60.3 kg, last menstrual period 01/20/2017, SpO2 100 %. Exam Physical Exam  Constitutional: She is oriented to person, place, and time.  GI:  Gravid soft FH 32 cm  Musculoskeletal: She exhibits no edema.  Neurological: She is alert and oriented to person, place, and time.  Skin: Skin is warm and dry.  Psychiatric: She has a normal mood and affect.    Prenatal labs: ABO, Rh: --/--/O POS (08/27 0440) Antibody: NEG (08/27 0440) Rubella: Immune (04/02 0000) RPR: Nonreactive (04/02 0000)  HBsAg: Negative (04/02 0000)   HIV: Non-reactive (04/02 0000)  GBS:   pending  Assessment/Plan [name] Previous C/S IUP@ 35 4/7 weeks P) admit. Routine labs. BMZ x 2. [name] antibiotics while awaiting completion of BMZ. Pt now request repeat C/S for mode of delivery,  NICU on high census. Case disc with Neonatologist  Julie Gonzalez 01/09/2018, 6:58 AM

## 2018-01-09 NOTE — MAU Provider Note (Signed)
History     Chief Complaint  Patient presents with  . Rupture of Membranes   32 yo G2P1 Asian female @ 8735 4/[redacted] weeks gestation presents with c/o waking up with underwear completely wet. Pt had no further leakage thereafter. Hx notable for previous C/S. GBS cx not done as yet. (+) FM. Per prenatal records, single closure of uterine incision with pt's plan for TOLAC  OB History    Gravida  2   Para  1   Term  1   Preterm      AB      Living        SAB      TAB      Ectopic      Multiple      Live Births              Past Medical History:  Diagnosis Date  . H/O: cesarean section     Past Surgical History:  Procedure Laterality Date  . CESAREAN SECTION      Family History  Problem Relation Age of Onset  . Diabetes Father   . Heart disease Paternal Grandmother     Social History   Tobacco Use  . Smoking status: Never Smoker  . Smokeless tobacco: Never Used  Substance Use Topics  . Alcohol use: No  . Drug use: No    Allergies: No Known Allergies  Medications Prior to Admission  Medication Sig Dispense Refill Last Dose  . Cholecalciferol 50000 units TABS 50,000 units PO qwk for 12 weeks. 12 tablet 0   . ferrous sulfate 325 (65 FE) MG tablet Take 1 tablet (325 mg total) by mouth 2 (two) times daily with a meal. 60 tablet 3   . LORazepam (ATIVAN) 0.5 MG tablet Take 1 tablet (0.5 mg total) by mouth at bedtime. 30 tablet 0 Taking     Physical Exam   Blood pressure 103/68, pulse 92, temperature 98.5 F (36.9 C), resp. rate 17, last menstrual period 01/20/2017.  General appearance: alert, cooperative and no distress Abdomen: gravid FH 32 cm Pelvic: cervix normal in appearance, external genitalia normal and mucoid clear fluid noted. cervix/ closed/50/-3 posterior. fern x 2 done. amnio sure done Extremities: no edema, redness or tenderness in the calves or thighs ED Course  Leaking fluid IUP@ 35 4/7 weeks Previous C/S P) await amni sure. Will  need sonogram for S<D  MDM   Serita KyleSheronette A Livvy Spilman, MD 3:34 AM 01/09/2018

## 2018-01-09 NOTE — Plan of Care (Signed)
  Problem: Education: Goal: Knowledge of disease or condition will improve Outcome: Progressing   

## 2018-01-09 NOTE — Progress Notes (Signed)
Spoke with pt and husband regarding care. Dr Juliene PinaMody who is her primary OB provider as been advised of admit and care as well as desire for rpt C/S

## 2018-01-10 ENCOUNTER — Encounter (HOSPITAL_COMMUNITY)
Admission: AD | Disposition: A | Discharge status: Home / Self Care | Payer: Self-pay | Source: Home / Self Care | Attending: Obstetrics & Gynecology

## 2018-01-10 ENCOUNTER — Inpatient Hospital Stay (HOSPITAL_COMMUNITY): Payer: Managed Care, Other (non HMO) | Admitting: Anesthesiology

## 2018-01-10 ENCOUNTER — Encounter (HOSPITAL_COMMUNITY): Payer: Self-pay | Admitting: Obstetrics & Gynecology

## 2018-01-10 DIAGNOSIS — Z98891 History of uterine scar from previous surgery: Secondary | ICD-10-CM

## 2018-01-10 HISTORY — DX: History of uterine scar from previous surgery: Z98.891

## 2018-01-10 LAB — CBC
HCT: 31.6 % — ABNORMAL LOW (ref 36.0–46.0)
HEMOGLOBIN: 10.6 g/dL — AB (ref 12.0–15.0)
MCH: 30.6 pg (ref 26.0–34.0)
MCHC: 33.5 g/dL (ref 30.0–36.0)
MCV: 91.3 fL (ref 78.0–100.0)
Platelets: 142 10*3/uL — ABNORMAL LOW (ref 150–400)
RBC: 3.46 MIL/uL — AB (ref 3.87–5.11)
RDW: 15.2 % (ref 11.5–15.5)
WBC: 10 10*3/uL (ref 4.0–10.5)

## 2018-01-10 SURGERY — Surgical Case
Anesthesia: Regional

## 2018-01-10 MED ORDER — ONDANSETRON HCL 4 MG/2ML IJ SOLN
INTRAMUSCULAR | Status: DC | PRN
  Administered 2018-01-10: 4 mg via INTRAVENOUS

## 2018-01-10 MED ORDER — PRENATAL MULTIVITAMIN CH
1.0000 | ORAL_TABLET | Freq: Every day | ORAL | Status: DC
  Administered 2018-01-11 – 2018-01-13 (×3): 1 via ORAL
  Filled 2018-01-10 (×3): qty 1

## 2018-01-10 MED ORDER — SIMETHICONE 80 MG PO CHEW
80.0000 mg | CHEWABLE_TABLET | ORAL | Status: DC
  Administered 2018-01-10 – 2018-01-13 (×3): 80 mg via ORAL
  Filled 2018-01-10 (×3): qty 1

## 2018-01-10 MED ORDER — ONDANSETRON HCL 4 MG/2ML IJ SOLN
INTRAMUSCULAR | Status: AC
  Filled 2018-01-10: qty 2

## 2018-01-10 MED ORDER — OXYCODONE HCL 5 MG PO TABS: 5.0000 mg | ORAL_TABLET | Freq: Four times a day (QID) | ORAL | Status: DC | PRN

## 2018-01-10 MED ORDER — PHENYLEPHRINE 8 MG IN D5W 100 ML (0.08MG/ML) PREMIX OPTIME
INJECTION | INTRAVENOUS | Status: DC | PRN
  Administered 2018-01-10: 60 ug/min via INTRAVENOUS

## 2018-01-10 MED ORDER — KETOROLAC TROMETHAMINE 30 MG/ML IJ SOLN
INTRAMUSCULAR | Status: AC
  Filled 2018-01-10: qty 1

## 2018-01-10 MED ORDER — FENTANYL CITRATE (PF) 100 MCG/2ML IJ SOLN
INTRAMUSCULAR | Status: AC
  Filled 2018-01-10: qty 2

## 2018-01-10 MED ORDER — SODIUM CHLORIDE 0.9 % IR SOLN
Status: DC | PRN
  Administered 2018-01-10: 1

## 2018-01-10 MED ORDER — MORPHINE SULFATE (PF) 0.5 MG/ML IJ SOLN
INTRAMUSCULAR | Status: AC
  Filled 2018-01-10: qty 10

## 2018-01-10 MED ORDER — IBUPROFEN 600 MG PO TABS
600.0000 mg | ORAL_TABLET | Freq: Four times a day (QID) | ORAL | Status: DC
  Administered 2018-01-11 – 2018-01-13 (×11): 600 mg via ORAL
  Filled 2018-01-10 (×12): qty 1

## 2018-01-10 MED ORDER — DEXAMETHASONE SODIUM PHOSPHATE 10 MG/ML IJ SOLN
INTRAMUSCULAR | Status: DC | PRN
  Administered 2018-01-10: 5 mg via INTRAVENOUS

## 2018-01-10 MED ORDER — DIPHENHYDRAMINE HCL 25 MG PO CAPS: 25.0000 mg | ORAL_CAPSULE | Freq: Four times a day (QID) | ORAL | Status: DC | PRN

## 2018-01-10 MED ORDER — COCONUT OIL OIL
1.0000 "application " | TOPICAL_OIL | Status: DC | PRN
  Administered 2018-01-13: 1 via TOPICAL
  Filled 2018-01-10: qty 120

## 2018-01-10 MED ORDER — CEFAZOLIN SODIUM-DEXTROSE 2-4 GM/100ML-% IV SOLN
2.0000 g | INTRAVENOUS | Status: AC
  Administered 2018-01-10: 2 g via INTRAVENOUS
  Filled 2018-01-10: qty 100

## 2018-01-10 MED ORDER — OXYTOCIN 10 UNIT/ML IJ SOLN
INTRAMUSCULAR | Status: AC
  Filled 2018-01-10: qty 4

## 2018-01-10 MED ORDER — DEXAMETHASONE SODIUM PHOSPHATE 10 MG/ML IJ SOLN
INTRAMUSCULAR | Status: AC
  Filled 2018-01-10: qty 1

## 2018-01-10 MED ORDER — MENTHOL 3 MG MT LOZG: 1.0000 | LOZENGE | OROMUCOSAL | Status: DC | PRN

## 2018-01-10 MED ORDER — MORPHINE SULFATE (PF) 0.5 MG/ML IJ SOLN
INTRAMUSCULAR | Status: DC | PRN
  Administered 2018-01-10: .15 mg via INTRATHECAL

## 2018-01-10 MED ORDER — DIPHENHYDRAMINE HCL 50 MG/ML IJ SOLN
12.5000 mg | Freq: Once | INTRAMUSCULAR | Status: AC
  Administered 2018-01-10: 12.5 mg via INTRAVENOUS

## 2018-01-10 MED ORDER — SOD CITRATE-CITRIC ACID 500-334 MG/5ML PO SOLN
30.0000 mL | Freq: Once | ORAL | Status: AC
  Administered 2018-01-10: 30 mL via ORAL
  Filled 2018-01-10: qty 15

## 2018-01-10 MED ORDER — FENTANYL CITRATE (PF) 100 MCG/2ML IJ SOLN
INTRAMUSCULAR | Status: DC | PRN
  Administered 2018-01-10: 15 ug via INTRATHECAL
  Administered 2018-01-10: 25 ug via INTRAVENOUS

## 2018-01-10 MED ORDER — DIPHENHYDRAMINE HCL 50 MG/ML IJ SOLN
INTRAMUSCULAR | Status: AC
  Filled 2018-01-10: qty 1

## 2018-01-10 MED ORDER — KETOROLAC TROMETHAMINE 30 MG/ML IJ SOLN
30.0000 mg | Freq: Once | INTRAMUSCULAR | Status: AC | PRN
  Administered 2018-01-10: 30 mg via INTRAVENOUS

## 2018-01-10 MED ORDER — TETANUS-DIPHTH-ACELL PERTUSSIS 5-2.5-18.5 LF-MCG/0.5 IM SUSP: 0.5000 mL | Freq: Once | INTRAMUSCULAR | Status: DC

## 2018-01-10 MED ORDER — SENNOSIDES-DOCUSATE SODIUM 8.6-50 MG PO TABS
2.0000 | ORAL_TABLET | ORAL | Status: DC
  Administered 2018-01-10 – 2018-01-13 (×3): 2 via ORAL
  Filled 2018-01-10 (×3): qty 2

## 2018-01-10 MED ORDER — LACTATED RINGERS IV BOLUS: 500.0000 mL | Freq: Once | INTRAVENOUS | Status: DC

## 2018-01-10 MED ORDER — ACETAMINOPHEN 325 MG PO TABS
650.0000 mg | ORAL_TABLET | ORAL | Status: DC | PRN
  Administered 2018-01-11: 650 mg via ORAL
  Filled 2018-01-10: qty 2

## 2018-01-10 MED ORDER — CEFAZOLIN SODIUM-DEXTROSE 2-4 GM/100ML-% IV SOLN
INTRAVENOUS | Status: AC
  Filled 2018-01-10: qty 100

## 2018-01-10 MED ORDER — SIMETHICONE 80 MG PO CHEW
80.0000 mg | CHEWABLE_TABLET | Freq: Three times a day (TID) | ORAL | Status: DC
  Administered 2018-01-11 – 2018-01-13 (×6): 80 mg via ORAL
  Filled 2018-01-10 (×7): qty 1

## 2018-01-10 MED ORDER — OXYTOCIN 40 UNITS IN LACTATED RINGERS INFUSION - SIMPLE MED: 2.5000 [IU]/h | INTRAVENOUS | Status: AC

## 2018-01-10 MED ORDER — OXYTOCIN 10 UNIT/ML IJ SOLN
INTRAVENOUS | Status: DC | PRN
  Administered 2018-01-10: 40 [IU] via INTRAVENOUS

## 2018-01-10 MED ORDER — LACTATED RINGERS IV SOLN
INTRAVENOUS | Status: DC
  Administered 2018-01-11: 03:00:00 via INTRAVENOUS

## 2018-01-10 MED ORDER — BUPIVACAINE IN DEXTROSE 0.75-8.25 % IT SOLN
INTRATHECAL | Status: DC | PRN
  Administered 2018-01-10: 1.6 mL via INTRATHECAL

## 2018-01-10 MED ORDER — SIMETHICONE 80 MG PO CHEW
80.0000 mg | CHEWABLE_TABLET | ORAL | Status: DC | PRN
  Administered 2018-01-11: 80 mg via ORAL

## 2018-01-10 SURGICAL SUPPLY — 37 items
BENZOIN TINCTURE PRP APPL 2/3 (GAUZE/BANDAGES/DRESSINGS) ×3 IMPLANT
CHLORAPREP W/TINT 26ML (MISCELLANEOUS) ×3 IMPLANT
CLAMP CORD UMBIL (MISCELLANEOUS) IMPLANT
CLOSURE STERI-STRIP 1/2X4 (GAUZE/BANDAGES/DRESSINGS) ×1
CLOSURE WOUND 1/2 X4 (GAUZE/BANDAGES/DRESSINGS)
CLOTH BEACON ORANGE TIMEOUT ST (SAFETY) ×3 IMPLANT
CLSR STERI-STRIP ANTIMIC 1/2X4 (GAUZE/BANDAGES/DRESSINGS) ×2 IMPLANT
DRSG OPSITE POSTOP 4X10 (GAUZE/BANDAGES/DRESSINGS) ×3 IMPLANT
ELECT REM PT RETURN 9FT ADLT (ELECTROSURGICAL) ×3
ELECTRODE REM PT RTRN 9FT ADLT (ELECTROSURGICAL) ×1 IMPLANT
EXTRACTOR VACUUM KIWI (MISCELLANEOUS) IMPLANT
EXTRACTOR VACUUM M CUP 4 TUBE (SUCTIONS) IMPLANT
EXTRACTOR VACUUM M CUP 4' TUBE (SUCTIONS)
GLOVE BIO SURGEON STRL SZ7 (GLOVE) ×3 IMPLANT
GLOVE BIOGEL PI IND STRL 7.0 (GLOVE) ×2 IMPLANT
GLOVE BIOGEL PI INDICATOR 7.0 (GLOVE) ×4
GOWN STRL REUS W/TWL LRG LVL3 (GOWN DISPOSABLE) ×6 IMPLANT
KIT ABG SYR 3ML LUER SLIP (SYRINGE) IMPLANT
NEEDLE HYPO 25X5/8 SAFETYGLIDE (NEEDLE) IMPLANT
NS IRRIG 1000ML POUR BTL (IV SOLUTION) ×3 IMPLANT
PACK C SECTION WH (CUSTOM PROCEDURE TRAY) ×3 IMPLANT
PAD OB MATERNITY 4.3X12.25 (PERSONAL CARE ITEMS) ×3 IMPLANT
RTRCTR C-SECT PINK 25CM LRG (MISCELLANEOUS) IMPLANT
STRIP CLOSURE SKIN 1/2X4 (GAUZE/BANDAGES/DRESSINGS) IMPLANT
SUT MNCRL 0 VIOLET CTX 36 (SUTURE) ×2 IMPLANT
SUT MONOCRYL 0 CTX 36 (SUTURE) ×4
SUT PLAIN 0 NONE (SUTURE) IMPLANT
SUT PLAIN 2 0 (SUTURE)
SUT PLAIN ABS 2-0 CT1 27XMFL (SUTURE) IMPLANT
SUT VIC AB 0 CT1 27 (SUTURE) ×4
SUT VIC AB 0 CT1 27XBRD ANBCTR (SUTURE) ×2 IMPLANT
SUT VIC AB 2-0 CT1 27 (SUTURE) ×2
SUT VIC AB 2-0 CT1 TAPERPNT 27 (SUTURE) ×1 IMPLANT
SUT VIC AB 4-0 KS 27 (SUTURE) ×3 IMPLANT
SUT VICRYL 0 TIES 12 18 (SUTURE) IMPLANT
TOWEL OR 17X24 6PK STRL BLUE (TOWEL DISPOSABLE) ×3 IMPLANT
TRAY FOLEY W/BAG SLVR 14FR LF (SET/KITS/TRAYS/PACK) IMPLANT

## 2018-01-10 NOTE — Progress Notes (Signed)
Patient ready for operating room.  She will be transported down in the bed.

## 2018-01-10 NOTE — Op Note (Signed)
Cesarean Section Procedure Note   Julie Gonzalez  01/10/2018 Procedure: Repeat Low Transverse Cesarean Section  Indications: 35.5 weeks, [name], s/p Betamethasone, prior c-section, [facility] as well as unfavorable cervix    Pre-operative Diagnosis: Preterm ruptured membranes, 35.5 weeks, prior c-section, for repeat    Post-operative Diagnosis: Same   Surgeon: Shea EvansMody, Eleshia Wooley, MD   Assistants: Serafina RoyalsMichelle Lawhorn, CNM   Anesthesia: spinal   Procedure Details:  The patient was seen in the Holding Room. The risks, benefits, complications, treatment options, and expected outcomes were discussed with the patient. The patient concurred with the proposed plan, giving informed consent. identified as Julie DagoAnitha Linders and the procedure verified as C-Section Delivery. A Time Out was held and the above information confirmed. 2 gm Ancef given.  After induction of anesthesia, foley placed and patient was draped and prepped in the usual sterile manner. A Pfannenstiel Incision was made and carried down through the subcutaneous tissue to the fascia. Fascial incision was made and extended transversely. The fascia was separated from the underlying rectus tissue superiorly and inferiorly. The peritoneum was identified and entered. Peritoneal incision was extended longitudinally. Alexis retractor placed. The utero-vesical peritoneal reflection was incised transversely and the bladder flap was bluntly freed from the lower uterine segment. A low transverse uterine incision was made. Clear amniotic fluid noted, no odor noted. Delivered from cephalic presentation was a vigorous FEMALE infant at 6.12 pm on 01/10/2018 with Apgar scores of 7 at one minute and 9 at five minutes. Delayed cord clamping at 1 minute and infant handed to NICU team in attendance. Umbilical cord blood was obtained for evaluation. Cord ph was not sent . The placenta was removed Intact and appeared normal. The uterine outline,  tubes and ovaries appeared normal. The uterine incision was closed with running locked sutures of 0 Monocryl followed by another imbricating layer. Hemostasis was observed. Alexis retractor removed. Peritoneal closure with 2-0 Vicryl. The fascia was then reapproximated with running sutures of 0Vicryl. The skin was closed with 4-0Vicryl. Sterile dressings placed.   Instrument, sponge, and needle counts were correct prior the abdominal closure and were correct at the conclusion of the case.   Findings: Female infant delivered from low transverse hysterotomy at 6.12 pm on 01/10/2018, delayed cord clamping at 1 minute. Apgars 7, 9 at 1 and 5 minutes. Normal placenta, clear amniotic fluid, no odor or increased temperature on uterine palpation. Normal uterus, both tubes and ovaries.    Estimated Blood Loss: 530 mL   Total IV Fluids: 1300 ml LR   Urine Output: 400CC OF clear urine  Specimens: cord blood   Complications: no complications  Disposition: PACU - hemodynamically stable.   Maternal Condition: stable   Baby condition / location:  Couplet care / Skin to Skin  Attending Attestation: I performed the procedure.   Signed: Surgeon(s): Shea EvansMody, Destanie Tibbetts, MD

## 2018-01-10 NOTE — Progress Notes (Signed)
No c/o; plan for c/s today No bleeding, +FM  Patient Vitals for the past 24 hrs:  BP Temp Temp src Pulse Resp SpO2 Weight  01/10/18 1124 (!) 91/54 98.1 F (36.7 C) Oral (!) 123 18 100 % -  01/10/18 0812 (!) 83/53 98 F (36.7 C) Oral (!) 112 18 98 % -  01/10/18 0500 - - - - - - 60.9 kg  01/10/18 0410 (!) 80/45 - - (!) 106 - - -  01/10/18 0356 (!) 77/47 98.4 F (36.9 C) Oral (!) 115 16 99 % -  01/10/18 0015 (!) 95/47 - - (!) 101 - - -  01/10/18 0004 (!) 80/48 98.2 F (36.8 C) Oral (!) 118 16 100 % -  01/09/18 2100 - - - (!) 126 - - -  01/09/18 1958 91/69 98.2 F (36.8 C) Oral (!) 136 16 100 % -  01/09/18 1558 (!) 96/59 98.3 F (36.8 C) Oral (!) 103 18 97 % -   Fht: cat 1 Toco: rare ctx  A&ox3 nml respirations Abd: soft, nt, gravid  A/p: iup at 35.5 wga, h/o ltcs srom - c/s planned this pm; pt stable Fetal status reassuring

## 2018-01-10 NOTE — Anesthesia Procedure Notes (Signed)
Spinal  Patient location during procedure: OR Start time: 01/10/2018 5:41 PM End time: 01/10/2018 5:51 PM Staffing Anesthesiologist: Elmer PickerWoodrum, Halaina Vanduzer L, MD Performed: anesthesiologist  Preanesthetic Checklist Completed: patient identified, surgical consent, pre-op evaluation, timeout performed, IV checked, risks and benefits discussed and monitors and equipment checked Spinal Block Patient position: sitting Prep: site prepped and draped and DuraPrep Patient monitoring: cardiac monitor, continuous pulse ox and blood pressure Approach: midline Location: L3-4 Injection technique: single-shot Needle Needle type: Pencan  Needle gauge: 24 G Needle length: 9 cm Assessment Sensory level: T6 Additional Notes Functioning IV was confirmed and monitors were applied. Sterile prep and drape, including hand hygiene and sterile gloves were used. The patient was positioned and the spine was prepped. The skin was anesthetized with lidocaine.  Free flow of clear CSF was obtained prior to injecting local anesthetic into the CSF.  The spinal needle aspirated freely following injection.  The needle was carefully withdrawn.  The patient tolerated the procedure well.

## 2018-01-10 NOTE — Anesthesia Preprocedure Evaluation (Signed)
Anesthesia Evaluation  Patient identified by MRN, date of birth, ID band Patient awake    Reviewed: Allergy & Precautions, NPO status , Patient's Chart, lab work & pertinent test results  Airway Mallampati: I  TM Distance: >3 FB Neck ROM: Full    Dental no notable dental hx. (+) Teeth Intact, Dental Advisory Given   Pulmonary neg pulmonary ROS,    Pulmonary exam normal breath sounds clear to auscultation       Cardiovascular negative cardio ROS Normal cardiovascular exam Rhythm:Regular Rate:Normal     Neuro/Psych negative neurological ROS  negative psych ROS   GI/Hepatic negative GI ROS, Neg liver ROS,   Endo/Other  negative endocrine ROS  Renal/GU negative Renal ROS  negative genitourinary   Musculoskeletal negative musculoskeletal ROS (+)   Abdominal   Peds  Hematology negative hematology ROS (+)   Anesthesia Other Findings   Reproductive/Obstetrics (+) Pregnancy Repeat C/S                             Anesthesia Physical Anesthesia Plan  ASA: II  Anesthesia Plan: Spinal   Post-op Pain Management:    Induction: Intravenous  PONV Risk Score and Plan: 2 and Dexamethasone, Ondansetron and Treatment may vary due to age or medical condition  Airway Management Planned: Natural Airway and Simple Face Mask  Additional Equipment:   Intra-op Plan:   Post-operative Plan:   Informed Consent: I have reviewed the patients History and Physical, chart, labs and discussed the procedure including the risks, benefits and alternatives for the proposed anesthesia with the patient or authorized representative who has indicated his/her understanding and acceptance.   Dental advisory given  Plan Discussed with: CRNA  Anesthesia Plan Comments:         Anesthesia Quick Evaluation

## 2018-01-10 NOTE — Transfer of Care (Signed)
Immediate Anesthesia Transfer of Care Note  Patient: Julie Gonzalez  Procedure(s) Performed: CESAREAN SECTION (N/A )  Patient Location: PACU  Anesthesia Type:Spinal  Level of Consciousness: awake, alert  and oriented  Airway & Oxygen Therapy: Patient Spontanous Breathing  Post-op Assessment: Report given to RN and Post -op Vital signs reviewed and stable  Post vital signs: Reviewed and stable  Last Vitals:  Vitals Value Taken Time  BP    Temp    Pulse    Resp    SpO2      Last Pain:  Vitals:   01/10/18 1554  TempSrc: Oral  PainSc:       Patients Stated Pain Goal: 3 (01/09/18 0512)  Complications: No apparent anesthesia complications

## 2018-01-10 NOTE — Progress Notes (Signed)
Patient ID: Julie Gonzalez, female   DOB: 1986/04/28, 32 y.o.   MRN: [id number] Pt has no complaints. No UCs/ VB. +FM. +LOF, clear  BP 90/64 (BP Location: Left Arm)   Pulse (!) 104   Temp 98.1 F (36.7 C) (Oral)   Resp 16   Ht 5\' 4"  (1.626 m)   Wt 60.9 kg   LMP 01/20/2017   SpO2 100%   BMI 23.04 kg/m   Maternal tachycardia overnight   Abdo soft, non tender gravid uterus  Extr no c/c/e FHT - 140s + accels no decels mod varaib- reactive Toco- irreg   A/P:  35.5 weeks, [name]. S/p BTMZ x 2 doses (2nd at 4 am today) Maternal tachycardia though non tender and no change in FHT, will proceed with RC/s this evening at 36 hrs from steroid 1st dose and not wait till 48 hrs for possible early chorioamnionitis.  Risks/complications of surgery reviewed incl infection, bleeding, damage to internal organs including bladder, bowels, ureters, blood vessels, other risks from anesthesia, VTE and delayed complications of any surgery, complications in future surgery reviewed. Also discussed neonatal complications incl difficult delivery, laceration, vacuum assistance, TTN etc. Pt understands and agrees, all concerns addressed.    V.Parth Mccormac, MD

## 2018-01-10 NOTE — Anesthesia Postprocedure Evaluation (Signed)
Anesthesia Post Note  Patient: Engineer, technical salesAnitha Gonzalez  Procedure(s) Performed: CESAREAN SECTION (N/A )     Patient location during evaluation: PACU Anesthesia Type: Spinal Level of consciousness: oriented and awake and alert Pain management: pain level controlled Vital Signs Assessment: post-procedure vital signs reviewed and stable Respiratory status: spontaneous breathing, respiratory function stable and patient connected to nasal cannula oxygen Cardiovascular status: blood pressure returned to baseline and stable Postop Assessment: no headache, no backache and no apparent nausea or vomiting Anesthetic complications: no    Last Vitals:  Vitals:   01/10/18 2025 01/10/18 2030  BP:  111/61  Pulse: (!) 56 (!) 57  Resp: 19 20  Temp: 36.6 C   SpO2: 99% 100%    Last Pain:  Vitals:   01/10/18 2030  TempSrc:   PainSc: 0-No pain   Pain Goal: Patients Stated Pain Goal: 3 (01/09/18 0512)               Romie Jumperhelsey L Raisa Ditto

## 2018-01-11 ENCOUNTER — Encounter (HOSPITAL_COMMUNITY): Payer: Self-pay | Admitting: Obstetrics & Gynecology

## 2018-01-11 DIAGNOSIS — O9902 Anemia complicating childbirth: Secondary | ICD-10-CM

## 2018-01-11 HISTORY — DX: Anemia complicating childbirth: O99.02

## 2018-01-11 LAB — CBC
HCT: 29.6 % — ABNORMAL LOW (ref 36.0–46.0)
Hemoglobin: 9.9 g/dL — ABNORMAL LOW (ref 12.0–15.0)
MCH: 30.1 pg (ref 26.0–34.0)
MCHC: 33.4 g/dL (ref 30.0–36.0)
MCV: 90 fL (ref 78.0–100.0)
Platelets: 153 10*3/uL (ref 150–400)
RBC: 3.29 MIL/uL — AB (ref 3.87–5.11)
RDW: 14.9 % (ref 11.5–15.5)
WBC: 14 10*3/uL — AB (ref 4.0–10.5)

## 2018-01-11 LAB — CULTURE, BETA STREP (GROUP B ONLY)

## 2018-01-11 MED ORDER — NALBUPHINE HCL 10 MG/ML IJ SOLN: 10.0000 mg | Freq: Once | INTRAMUSCULAR | Status: DC

## 2018-01-11 MED ORDER — POLYSACCHARIDE IRON COMPLEX 150 MG PO CAPS
150.0000 mg | ORAL_CAPSULE | Freq: Every day | ORAL | Status: DC
  Administered 2018-01-12 – 2018-01-13 (×2): 150 mg via ORAL
  Filled 2018-01-11 (×2): qty 1

## 2018-01-11 MED ORDER — MAGNESIUM OXIDE 400 (241.3 MG) MG PO TABS
400.0000 mg | ORAL_TABLET | Freq: Every day | ORAL | Status: DC
  Administered 2018-01-12 – 2018-01-13 (×2): 400 mg via ORAL
  Filled 2018-01-11 (×4): qty 1

## 2018-01-11 NOTE — Lactation Note (Signed)
This note was copied from a baby's chart. Lactation Consultation Note  Patient Name: Julie Gonzalez Date: 01/11/2018 Reason for consult: Initial assessment;Late-preterm 34-36.6wks;Infant < 6lbs Breastfeeding consultation services and support information given and reviewed.  Parents have Taking Care Of Your Late Preterm Baby handout.  Reviewed volumes for supplementation.  Discussed late preterm feeding behaviors.  Explained it may be a few weeks before the baby feeds well.  Stressed importance of supplementing every 3 hours.  Mom states baby does well with bottle.  He is currently taking 5-10 mls of neosure every 3 hours.  Mom has pumped once with symphony pump.  Instructed to put baby to breast with feeding cues, supplement per volume parameters and pump both breasts x 15 minutes every 3 hours.  Encouraged to call for concerns/assist prn.  Maternal Data Does the patient have breastfeeding experience prior to this delivery?: Yes  Feeding Feeding Type: Bottle Fed - Formula Nipple Type: Slow - flow Length of feed: (few sucks)  LATCH Score                   Interventions    Lactation Tools Discussed/Used Initiated by:: RN Date initiated:: 01/10/18   Consult Status Consult Status: Follow-up Date: 01/12/18 Follow-up type: In-patient    Huston FoleyMOULDEN, Mohit Zirbes S 01/11/2018, 12:05 PM

## 2018-01-11 NOTE — Progress Notes (Signed)
Patient ID: Julie Gonzalez, female   DOB: [birth year], 32 y.o.   MRN: [id number]  Preterm ruptured membranes, 35.5 weeks, prior c-section, repeat  C/S  Subjective: POD# 1 Information for the patient's newborn:  Jeral PinchMahendran, Boy Tabathia [[id number]][[id number]]  female    circ declined Baby name: undecided  Reports feeling well Feeding: breast and bottle Patient reports tolerating PO.  Breast symptoms: none Pain controlled with  PO meds Denies HA/SOB/C/P/N/V/dizziness. Flatus present. She reports vaginal bleeding as normal, without clots.  Ambulated to BR for pericare once, Foley cath still in place.    Objective:   VS:    Vitals:   01/11/18 0020 01/11/18 0300 01/11/18 0523 01/11/18 1044  BP:   (!) 93/53 (!) 90/55  Pulse:   65 61  Resp:  16 16 18   Temp:  97.8 F (36.6 C) 97.7 F (36.5 C) 98 F (36.7 C)  TempSrc:  Oral Oral Oral  SpO2: 100% 98% 98% 100%  Weight:      Height:          Intake/Output Summary (Last 24 hours) at 01/11/2018 1057 Last data filed at 01/11/2018 0854 Gross per 24 hour  Intake 1300 ml  Output 2905 ml  Net -1605 ml        Recent Labs    01/10/18 0853 01/11/18 0559  WBC 10.0 14.0*  HGB 10.6* 9.9*  HCT 31.6* 29.6*  PLT 142* 153     Blood type: --/--/O POS, O POS Performed at [facility], [address] [place], [place] [address]  [phone](08/27 [id number])  Rubella: Immune (04/02 0000)     Physical Exam:  General: alert, cooperative and no distress CV: Regular rate and rhythm Resp: clear Abdomen: soft, nontender, normal bowel sounds Incision: clean, dry and intact Uterine Fundus: firm, below umbilicus, nontender Lochia: none Ext: no edema, redness or tenderness in the calves or thighs   Assessment/Plan: 32 y.o.   POD# 1. [id number]                  Principal Problem:   PP C/S (8/28) Active Problems:   PROM (premature rupture of membranes)   Maternal anemia, with delivery   Postpartum care following cesarean delivery   Doing well, stable.     Start oral Fe and Mag ox           Advance diet as tolerated Encourage rest when baby rests Breastfeeding support Encourage to ambulate, dc foley this AM Routine post-op care  Neta Mendsaniela C Paul, CNM, MSN 01/11/2018, 10:57 AM

## 2018-01-12 NOTE — Lactation Note (Signed)
This note was copied from a baby's chart. Lactation Consultation Note Baby 32 hrs old. Mom sleeping soundly/ FOB caring for baby. Baby hasn't been getting enough supplement. Mom isn't always BF then taking small amounts of formula. Discussed amount needed for feeding after breast and with out BF.  FOB stated he had tried all the things I had told him to do. LC held baby and fed formula. Baby needed burped after 8 ml, then gave 6 more ml, burped again. Encouraged to hold baby upright for a little while after feeding for baby to digest it.  Reviewed LPI feeding supplementing sheet w/FOB. Stated he understood.  Patient Name: Julie Gonzalez OZHYQ'MToday's Date: 01/12/2018 Reason for consult: Follow-up assessment;Infant < 6lbs;Late-preterm 34-36.6wks   Maternal Data    Feeding Feeding Type: Bottle Fed - Formula Nipple Type: Slow - flow  LATCH Score                   Interventions    Lactation Tools Discussed/Used     Consult Status Consult Status: Follow-up Date: 01/12/18 Follow-up type: In-patient    Julie Gonzalez, Julie Gonzalez 01/12/2018, 2:29 AM

## 2018-01-12 NOTE — Progress Notes (Signed)
Patient ID: Julie Gonzalez, female   DOB: [birth year], 32 y.o.   MRN: [id number]  Post Partum Day # 2, s/p repeat cesarean section, preterm rupture of membranes  Subjective:  Resting in bed holding infant. Eating and drinking without difficulty. LBM: 08/30 0630.   Denies difficulty breathing or respiratory distress, chest pain, headache, abdominal pain, excessive vaginal bleeding, dysuria, and leg pain or swelling.   Objective:  Temp:  [97.5 F (36.4 C)-98.1 F (36.7 C)] 98 F (36.7 C) (08/30 0521) Pulse Rate:  [60-87] 60 (08/30 0521) Resp:  [14-20] 14 (08/30 0521) BP: (90-101)/(55-69) 90/57 (08/30 0521) SpO2:  [99 %-100 %] 100 % (08/29 1829)  Physical Exam:   General: alert and cooperative   Lungs: clear to auscultation bilaterally  Breasts: normal appearance, no masses or tenderness  Heart: normal apical impulse  Abdomen: soft, non-tender; bowel sounds normal; no masses,  no organomegaly; incision covered by dressing-no new drainage  Pelvis: Lochia: appropriate, Uterine Fundus: firm  Extremities: DVT Evaluation: No evidence of DVT seen on physical exam.  Recent Labs    01/10/18 0853 01/11/18 0559  HGB 10.6* 9.9*  HCT 31.6* 29.6*    Assessment:  32 year old G2P2, postpartum day #2, repeat cesarean section, preterm rupture of membranes, maternal anemia, Rh positive  Breastfeeding with formula supplementation  Plan:  Encouraged use of breast pump.   Routine postpartum care and education.   Reviewed red flag symptoms and when to call.   Continue orders as written. Reassess as needed.    LOS: 3 days   Gunnar BullaJenkins Michelle Lawhorn, CNM [facility] 01/12/2018 0830

## 2018-01-13 MED ORDER — IBUPROFEN 600 MG PO TABS: 600.0000 mg | ORAL_TABLET | Freq: Four times a day (QID) | ORAL | 0 refills | Status: DC

## 2018-01-13 MED ORDER — OXYCODONE HCL 5 MG PO TABS: 5.0000 mg | ORAL_TABLET | Freq: Four times a day (QID) | ORAL | 0 refills | Status: DC | PRN

## 2018-01-13 MED ORDER — ACETAMINOPHEN 325 MG PO TABS: 650.0000 mg | ORAL_TABLET | ORAL | Status: DC | PRN

## 2018-01-13 MED ORDER — COCONUT OIL OIL: 1.0000 "application " | TOPICAL_OIL | 0 refills | Status: DC | PRN

## 2018-01-13 MED ORDER — MAGNESIUM OXIDE 400 (241.3 MG) MG PO TABS: 400.0000 mg | ORAL_TABLET | Freq: Every day | ORAL | Status: DC

## 2018-01-13 NOTE — Lactation Note (Addendum)
This note was copied from a baby's chart. Lactation Consultation Note  Patient Name: Boy Denyse Dagonitha Lorman ZOXWR'UToday's Date: 01/13/2018   Baby 64 hours old. 4163w5d. < 6 lbs.  Baby was recently supplemented w/ 20 ml of formula. Mother states baby has been sleepy at the breast. Encouraged mother to pump q 3 hours) to help establish milk supply. Mom encouraged to feed baby 8-12 times/24 hours and with feeding cues at least q 3 hours.  Reviewed engorgement care and monitoring voids/stools. Provided mother w/ coconut oil for soreness.  Suggest mother call to observe next feeding.  Mother has manual pump.       Maternal Data    Feeding Feeding Type: Bottle Fed - Formula  LATCH Score                   Interventions    Lactation Tools Discussed/Used     Consult Status      Hardie PulleyBerkelhammer, Gusta Marksberry Boschen 01/13/2018, 10:20 AM

## 2018-01-13 NOTE — Discharge Summary (Signed)
OB Discharge Summary     Patient Name: Julie Gonzalez DOB: 01/13/1986 MRN: [id number]  Date of admission: 01/09/2018 Delivering MD: MODY, VAISHALI   Date of discharge: 01/13/2018  Admitting diagnosis: 35 WEEKS LEAKING FLUID Intrauterine pregnancy: [id number]     Secondary diagnosis:  Principal Problem:   PP C/S (8/28) Active Problems:   PROM (premature rupture of membranes)   Maternal anemia, with delivery   Postpartum care following cesarean delivery     Discharge diagnosis:  Patient Active Problem List   Diagnosis Date Noted  . Maternal anemia, with delivery 01/11/2018  . Postpartum care following cesarean delivery 01/11/2018  . PP C/S (8/28) 01/10/2018  . PROM (premature rupture of membranes) 01/09/2018                                                Post partum procedures:none   Complications: None  Hospital course:  Sceduled C/S   [name] was admitted to the hospital 01/09/2018 for scheduled cesarean section with the following indication:Elective Repeat and [name].  Membrane Rupture Time/Date: 1:00 AM ,01/09/2018  Patient was observed inpatient x 36 hours for betamethasone course for fetal lung maturation, proceeded prior to 48 hour window d/t maternal fever developing and concern for early chorioamnionitis. Patient delivered a Viable infant.01/10/2018  Details of operation can be found in separate operative note.  Pateint had an uncomplicated postpartum course.  She is ambulating, tolerating a regular diet, passing flatus, and urinating well. Patient is discharged home in stable condition on  01/13/18         Physical exam  Vitals:   01/12/18 1504 01/12/18 2302 01/13/18 0106 01/13/18 0602  BP: 103/67 104/76 119/68 115/66  Pulse: 90 90 69 66  Resp: 18 18 18 18   Temp: 98.3 F (36.8 C) 98 F (36.7 C) 97.9 F (36.6 C) 98.2 F (36.8 C)  TempSrc: Oral Oral Oral Oral  SpO2: 100%     Weight:      Height:       General: alert, cooperative and no  distress Lochia: appropriate Uterine Fundus: firm Incision: Dressing is clean, dry, and intact DVT Evaluation: No cords or calf tenderness. No significant calf/ankle edema. Labs: Lab Results  Component Value Date   WBC 14.0 (H) 01/11/2018   HGB 9.9 (L) 01/11/2018   HCT 29.6 (L) 01/11/2018   MCV 90.0 01/11/2018   PLT 153 01/11/2018   CMP Latest Ref Rng & Units 03/17/2017  Glucose 65 - 99 mg/dL 74  BUN 7 - 25 mg/dL 9  Creatinine 7.820.50 - 9.561.10 mg/dL 2.130.58  Sodium 086135 - 578146 mmol/L 140  Potassium 3.5 - 5.3 mmol/L 4.0  Chloride 98 - 110 mmol/L 108  CO2 20 - 32 mmol/L 22  Calcium 8.6 - 10.2 mg/dL 4.6(N8.4(L)  Total Protein 6.1 - 8.1 g/dL 6.9  Total Bilirubin 0.2 - 1.2 mg/dL 0.3  AST 10 - 30 U/L 14  ALT 6 - 29 U/L 10    Discharge instruction: per After Visit Summary and "Baby and Me Booklet".  After visit meds:  Allergies as of 01/13/2018   No Known Allergies     Medication List    STOP taking these medications   LORazepam 0.5 MG tablet Commonly known as:  ATIVAN     TAKE these medications   acetaminophen 325 MG tablet  Commonly known as:  TYLENOL Take 2 tablets (650 mg total) by mouth every 4 (four) hours as needed (for pain scale < 4).   Cholecalciferol 50000 units Tabs 50,000 units PO qwk for 12 weeks.   coconut oil Oil Apply 1 application topically as needed.   ferrous sulfate 325 (65 FE) MG tablet Take 1 tablet (325 mg total) by mouth 2 (two) times daily with a meal. What changed:  when to take this   ibuprofen 600 MG tablet Commonly known as:  ADVIL,MOTRIN Take 1 tablet (600 mg total) by mouth every 6 (six) hours.   magnesium oxide 400 (241.3 Mg) MG tablet Commonly known as:  MAG-OX Take 1 tablet (400 mg total) by mouth daily. Start taking on:  01/14/2018   oxyCODONE 5 MG immediate release tablet Commonly known as:  Oxy IR/ROXICODONE Take 1 tablet (5 mg total) by mouth every 6 (six) hours as needed (pain scale 4-7).   prenatal multivitamin Tabs tablet Take 1  tablet by mouth daily at 12 noon.            Discharge Care Instructions  (From admission, onward)         Start     Ordered   01/13/18 0000  Discharge wound care:    Comments:  Sitz baths 2 times /day with warm water x 1 week   01/13/18 1013          Diet: routine diet  Activity: Advance as tolerated. Pelvic rest for 6 weeks.   Outpatient follow up:  Follow-up Information    Shea Evans, MD. Schedule an appointment as soon as possible for a visit in 6 week(s).   Specialty:  Obstetrics and Gynecology Contact information: [address] [place] [address] [phone]           Postpartum contraception: Not Discussed  Newborn Data: Live born female  Birth Weight: 5 lb 10.7 oz (2570 g) APGAR: 7, 9  Newborn Delivery   Birth date/time:  01/10/2018 18:12:00 Delivery type:  C-Section, Low Transverse Trial of labor:  No C-section categorization:  Repeat     Baby Feeding: Bottle and Breast Disposition:home with mother   01/13/2018 Julie Gonzalez, CNM

## 2018-01-14 ENCOUNTER — Ambulatory Visit: Payer: Self-pay

## 2018-01-14 NOTE — Lactation Note (Signed)
This note was copied from a baby's chart. Lactation Consultation Note  Patient Name: Julie Gonzalez [id number] Date: 01/14/2018 Reason for consult: Follow-up assessment;Late-preterm 34-36.6wks   Baby 51 hours old.  [redacted]w[redacted]d.  Baby cueing. Suggest mother try breastfeeding but mother refused stating he recently had a bottle of formula. Discussed supply and demand and she stated she breastfed him last night.   She is complaining of soreness w/ fullness in her breasts which are filling. Suggest she either breastfeed for pump.  No cracks or abrasions. Mom encouraged to feed baby 8-12 times/24 hours and with feeding cues at least q 3 hours.  Reviewed engorgement care and monitoring voids/stools.    Maternal Data    Feeding Feeding Type: Bottle Fed - Formula  LATCH Score                   Interventions Interventions: DEBP;Hand pump  Lactation Tools Discussed/Used     Consult Status Consult Status: Complete Date: 01/14/18    Dahlia Byes [facility] 01/14/2018, 9:41 AM

## 2018-03-20 ENCOUNTER — Encounter: Payer: Self-pay | Admitting: Family Medicine

## 2018-03-21 ENCOUNTER — Ambulatory Visit (INDEPENDENT_AMBULATORY_CARE_PROVIDER_SITE_OTHER): Payer: Managed Care, Other (non HMO) | Admitting: Family Medicine

## 2018-03-21 ENCOUNTER — Encounter: Payer: Self-pay | Admitting: Family Medicine

## 2018-03-21 VITALS — BP 110/66 | HR 95 | Temp 98.1°F | Ht 64.0 in | Wt 129.0 lb

## 2018-03-21 DIAGNOSIS — O9902 Anemia complicating childbirth: Secondary | ICD-10-CM | POA: Diagnosis not present

## 2018-03-21 DIAGNOSIS — Z13220 Encounter for screening for lipoid disorders: Secondary | ICD-10-CM

## 2018-03-21 DIAGNOSIS — Z98891 History of uterine scar from previous surgery: Secondary | ICD-10-CM

## 2018-03-21 DIAGNOSIS — D500 Iron deficiency anemia secondary to blood loss (chronic): Secondary | ICD-10-CM

## 2018-03-21 DIAGNOSIS — R5383 Other fatigue: Secondary | ICD-10-CM | POA: Diagnosis not present

## 2018-03-21 DIAGNOSIS — Z0000 Encounter for general adult medical examination without abnormal findings: Secondary | ICD-10-CM | POA: Diagnosis not present

## 2018-03-21 DIAGNOSIS — R5381 Other malaise: Secondary | ICD-10-CM

## 2018-03-21 LAB — LIPID PANEL
Cholesterol: 203 mg/dL — ABNORMAL HIGH (ref 0–200)
HDL: 41.8 mg/dL (ref >39.00)
LDL Cholesterol: 126 mg/dL — ABNORMAL HIGH (ref 0–99)
NonHDL: 160.89
Total CHOL/HDL Ratio: 5
Triglycerides: 174 mg/dL — ABNORMAL HIGH (ref 0.0–149.0)
VLDL: 34.8 mg/dL (ref 0.0–40.0)

## 2018-03-21 LAB — CBC WITH DIFFERENTIAL/PLATELET
Basophils Absolute: 0 10*3/uL (ref 0.0–0.1)
Basophils Relative: 0.6 % (ref 0.0–3.0)
Eosinophils Absolute: 0.2 10*3/uL (ref 0.0–0.7)
Eosinophils Relative: 3.5 % (ref 0.0–5.0)
HCT: 39.5 % (ref 36.0–46.0)
Hemoglobin: 13.3 g/dL (ref 12.0–15.0)
Lymphocytes Relative: 40.3 % (ref 12.0–46.0)
Lymphs Abs: 2.5 10*3/uL (ref 0.7–4.0)
MCHC: 33.6 g/dL (ref 30.0–36.0)
MCV: 85.9 fl (ref 78.0–100.0)
Monocytes Absolute: 0.5 10*3/uL (ref 0.1–1.0)
Monocytes Relative: 8.1 % (ref 3.0–12.0)
Neutro Abs: 2.9 10*3/uL (ref 1.4–7.7)
Neutrophils Relative %: 47.5 % (ref 43.0–77.0)
Platelets: 230 10*3/uL (ref 150.0–400.0)
RBC: 4.6 Mil/uL (ref 3.87–5.11)
RDW: 14 % (ref 11.5–15.5)
WBC: 6.1 10*3/uL (ref 4.0–10.5)

## 2018-03-21 LAB — COMPREHENSIVE METABOLIC PANEL
ALT: 35 U/L (ref 0–35)
AST: 19 U/L (ref 0–37)
Albumin: 4.3 g/dL (ref 3.5–5.2)
Alkaline Phosphatase: 48 U/L (ref 39–117)
BUN: 11 mg/dL (ref 6–23)
CO2: 29 mEq/L (ref 19–32)
Calcium: 9.6 mg/dL (ref 8.4–10.5)
Chloride: 103 mEq/L (ref 96–112)
Creatinine, Ser: 0.56 mg/dL (ref 0.40–1.20)
GFR: 131.34 mL/min (ref >60.00)
Glucose, Bld: 96 mg/dL (ref 70–99)
Potassium: 4.2 mEq/L (ref 3.5–5.1)
Sodium: 138 mEq/L (ref 135–145)
Total Bilirubin: 0.4 mg/dL (ref 0.2–1.2)
Total Protein: 7.4 g/dL (ref 6.0–8.3)

## 2018-03-21 NOTE — Progress Notes (Signed)
Subjective:    Julie Gonzalez is a 32 y.o. female and is here for a comprehensive physical exam.  .  ferrous sulfate 325 (65 FE) MG tablet, Take 1 tablet (325 mg total) by mouth 2 (two) times daily with a meal. (Patient taking differently: Take 325 mg by mouth daily with breakfast. ), Disp: 60 tablet, Rfl: 3 .  magnesium oxide (MAG-OX) 400 (241.3 Mg) MG tablet, Take 1 tablet (400 mg total) by mouth daily., Disp: , Rfl:  .  Prenatal Vit-Fe Fumarate-FA (PRENATAL MULTIVITAMIN) TABS tablet, Take 1 tablet by mouth daily at 12 noon., Disp: , Rfl:   Health Maintenance Due  Topic Date Due  . TETANUS/TDAP  10/02/2002  . INFLUENZA VACCINE  12/14/2017   PMHx, SurgHx, SocialHx, Medications, and Allergies were reviewed in the Visit Navigator and updated as appropriate.   Past Medical History:  Diagnosis Date  . Maternal anemia, with delivery 01/11/2018  . Status post repeat low transverse cesarean section [Z98.891] 01/10/2018     Past Surgical History:  Procedure Laterality Date  . CESAREAN SECTION N/A 01/10/2018   Procedure: CESAREAN SECTION;  Surgeon: Shea Evans, MD;  Location: [facility] BIRTHING SUIT[facility];  Service: Obstetrics;  Laterality: N/A;     Family History  Problem Relation Age of Onset  . Diabetes Father   . Heart disease Paternal Grandmother     Social History   Tobacco Use  . Smoking status: Never Smoker  . Smokeless tobacco: Never Used  Substance Use Topics  . Alcohol use: No  . Drug use: No    Review of Systems:   Pertinent items are noted in the HPI. Otherwise, ROS is negative.  Objective:   BP 110/66   Pulse 95   Temp 98.1 F (36.7 C) (Oral)   Ht 5\' 4"  (1.626 m)   Wt 129 lb (58.5 kg)   LMP 01/20/2017   SpO2 96%   BMI 22.14 kg/m    General appearance: alert, cooperative and appears stated age. Head: normocephalic, without obvious abnormality, atraumatic. Neck: no adenopathy, supple, symmetrical, trachea midline; thyroid not enlarged, symmetric, no  tenderness/mass/nodules. Lungs: clear to auscultation bilaterally. Heart: regular rate and rhythm Abdomen: soft, non-tender; no masses,  no organomegaly. Extremities: extremities normal, atraumatic, no cyanosis or edema. Skin: skin color, texture, turgor normal, no rashes or lesions. Lymph: cervical, supraclavicular, and axillary nodes normal; no abnormal inguinal nodes palpated. Neurologic: grossly normal.  Assessment/Plan:   Julie Gonzalez was seen today for annual exam.  Diagnoses and all orders for this visit:  Routine physical examination  Iron deficiency anemia due to chronic blood loss -     CBC with Differential/Platelet -     Iron, TIBC and Ferritin Panel  Malaise and fatigue, postpartum -     Comprehensive metabolic panel  PP C/S (8/28) -     Comprehensive metabolic panel  Maternal anemia, with delivery -     CBC with Differential/Platelet -     Iron, TIBC and Ferritin Panel  Screening for lipid disorders -     Lipid panel  Difficulty of mother performing breastfeeding   Patient Counseling:   [x]     Nutrition: Stressed importance of moderation in sodium/caffeine intake, saturated fat and cholesterol, caloric balance, sufficient intake of fresh fruits, vegetables, fiber, calcium, iron, and 1 mg of folate supplement per day (for females capable of pregnancy).   [x]      Stressed the importance of regular exercise.    [x]     Substance Abuse: Discussed cessation/primary prevention  of tobacco, alcohol, or other drug use; driving or other dangerous activities under the influence; availability of treatment for abuse.    [x]      Injury prevention: Discussed safety belts, safety helmets, smoke detector, smoking near bedding or upholstery.    [x]      Sexuality: Discussed sexually transmitted diseases, partner selection, use of condoms, avoidance of unintended pregnancy  and contraceptive alternatives.    [x]     Dental health: Discussed importance of regular tooth  brushing, flossing, and dental visits.   [x]      Health maintenance and immunizations reviewed. Please refer to Health maintenance section.   Helane Rima, DO [facility]

## 2018-03-22 ENCOUNTER — Encounter: Payer: Self-pay | Admitting: Family Medicine

## 2018-03-22 LAB — IRON,TIBC AND FERRITIN PANEL
%SAT: 25 % (calc) (ref 16–45)
Ferritin: 27 ng/mL (ref 16–154)
Iron: 79 ug/dL (ref 40–190)
TIBC: 315 mcg/dL (calc) (ref 250–450)

## 2018-06-25 ENCOUNTER — Ambulatory Visit: Payer: Self-pay

## 2018-06-25 ENCOUNTER — Encounter: Payer: Self-pay | Admitting: Family Medicine

## 2018-06-25 ENCOUNTER — Other Ambulatory Visit: Payer: Self-pay

## 2018-06-25 ENCOUNTER — Ambulatory Visit (INDEPENDENT_AMBULATORY_CARE_PROVIDER_SITE_OTHER): Payer: Managed Care, Other (non HMO) | Admitting: Family Medicine

## 2018-06-25 VITALS — BP 90/56 | HR 166 | Temp 103.2°F | Resp 14 | Ht 64.0 in | Wt 129.0 lb

## 2018-06-25 DIAGNOSIS — R509 Fever, unspecified: Secondary | ICD-10-CM | POA: Diagnosis not present

## 2018-06-25 DIAGNOSIS — E860 Dehydration: Secondary | ICD-10-CM | POA: Diagnosis not present

## 2018-06-25 DIAGNOSIS — R000 Tachycardia, unspecified: Secondary | ICD-10-CM

## 2018-06-25 DIAGNOSIS — I951 Orthostatic hypotension: Secondary | ICD-10-CM

## 2018-06-25 DIAGNOSIS — R112 Nausea with vomiting, unspecified: Secondary | ICD-10-CM

## 2018-06-25 LAB — POCT INFLUENZA A/B
INFLUENZA B, POC: NEGATIVE
Influenza A, POC: NEGATIVE

## 2018-06-25 MED ORDER — ONDANSETRON 4 MG PO TBDP
8.0000 mg | ORAL_TABLET | Freq: Once | ORAL | Status: AC
  Administered 2018-06-25: 8 mg via ORAL

## 2018-06-25 MED ORDER — IBUPROFEN 200 MG PO TABS
600.0000 mg | ORAL_TABLET | Freq: Once | ORAL | Status: AC
  Administered 2018-06-25: 600 mg via ORAL

## 2018-06-25 NOTE — Telephone Encounter (Signed)
Pt husband called back stating that the patient now has fever and chills. Pt had been triaged earlier for cough and body aches and refused appointment at another location.  She originally called to ask what medications she could take OTC while breast feeding. After triage she was going to urgent care.  She refused appointment at another location. Her husband wanted her to have an appointment. He requested [place] location. Appointment scheduled per protocol.  Care advice read to patient husband. He verbalized understanding of all instructions.  Reason for Disposition . [1] Fever > 100.0 F (37.8 C) AND [2] foreign travel to a developing country in the last month  Answer Assessment - Initial Assessment Questions 1. TEMPERATURE: "What is the most recent temperature?"  "How was it measured?"      Ear temp 99 2. ONSET: "When did the fever start?"      today 3. SYMPTOMS: "Do you have any other symptoms besides the fever?"  (e.g., colds, headache, sore throat, earache, cough, rash, diarrhea, vomiting, abdominal pain)     Sore throat body aches cough 4. CAUSE: If there are no symptoms, ask: "What do you think is causing the fever?"      unsure 5. CONTACTS: "Does anyone else in the family have an infection?"     Child has cold 6. TREATMENT: "What have you done so far to treat this fever?" (e.g., medications)     None breast fever 7. IMMUNOCOMPROMISE: "Do you have of the following: diabetes, HIV positive, splenectomy, cancer chemotherapy, chronic steroid treatment, transplant patient, etc."     N/A 8. PREGNANCY: "Is there any chance you are pregnant?" "When was your last menstrual period?"     No breast feeding 9. TRAVEL: "Have you traveled out of the country in the last month?" (e.g., travel history, exposures)     No  Protocols used: FEVER-A-AH

## 2018-06-25 NOTE — Telephone Encounter (Signed)
See note

## 2018-06-25 NOTE — Progress Notes (Signed)
Subjective   CC:  Chief Complaint  Patient presents with  . Fever    Started this morning   Same day acute visit; PCP not available. New pt to me. Chart reviewed.   HPI: Julie Gonzalez is a 33 y.o. female who presents to the office today to address the problems listed above in the chief complaint.  Patient complains of flu like symptoms including myalgias, high fever, ST, mild cough and some congestion. Sxs started with mild ST last night but this am has felt worse with fever, and n/v. Ate porridge but vomited and has barely been able to drink anything. Denies abdominal pain, dysuria, or SOB. No recent travel. Hasn't taken any medications today. She is breastfeeding her 9 month old son. She feels like she could pass out when she stands. She did have the flu vaccine this season.   BP Readings from Last 3 Encounters:  06/25/18 (!) 90/56  03/21/18 110/66  01/13/18 115/66    Assessment  1. Febrile illness   2. Sinus tachycardia   3. Dehydration, moderate   4. Non-intractable vomiting with nausea, unspecified vomiting type   5. Orthostatic hypotension      Plan   Fever and dehydration with lightheadedness and tachycardia; refer to ER due to hypotension, n/v and mother of 6 mo infant. rec further eval with labs an IV Fluids for rehydration. Then hopefully will feel better and get home.   Ibuprofen 600mg  and zofran 8mg  administered in office. Pt was stable upon discharge. [facility] notified of transfer.   Follow up: prn   Orders Placed This Encounter  Procedures  . POCT Influenza A/B   Meds ordered this encounter  Medications  . ibuprofen (ADVIL,MOTRIN) tablet 600 mg  . ondansetron (ZOFRAN-ODT) disintegrating tablet 8 mg     I reviewed the patients updated PMH, FH, and SocHx.    Patient Active Problem List   Diagnosis Date Noted  . Difficulty of mother performing breastfeeding 03/22/2018  . Maternal anemia, with delivery 01/11/2018  . PP C/S (8/28) 01/10/2018    Current Meds  Medication Sig  . ferrous sulfate 325 (65 FE) MG tablet Take 1 tablet (325 mg total) by mouth 2 (two) times daily with a meal. (Patient taking differently: Take 325 mg by mouth daily with breakfast. )  . magnesium oxide (MAG-OX) 400 (241.3 Mg) MG tablet Take 1 tablet (400 mg total) by mouth daily.  . [DISCONTINUED] Prenatal Vit-Fe Fumarate-FA (PRENATAL MULTIVITAMIN) TABS tablet Take 1 tablet by mouth daily at 12 noon.   Current Facility-Administered Medications for the 06/25/18 encounter (Office Visit) with Willow Ora, MD  Medication  . ibuprofen (ADVIL,MOTRIN) tablet 600 mg  . ondansetron (ZOFRAN-ODT) disintegrating tablet 8 mg   Family History: Patient family history includes Diabetes in her father; Heart disease in her paternal grandmother. Social History:  Patient  reports that she has never smoked. She has never used smokeless tobacco. She reports that she does not drink alcohol or use drugs.  Review of Systems: Constitutional: + for fever or malaise Ophthalmic: negative for photophobia, double vision or loss of vision Cardiovascular: negative for chest pain, dyspnea on exertion, or new LE swelling Respiratory: negative for SOB, + cough Gastrointestinal: negative for abdominal pain, change in bowel habits or melena, + nausea and vomiting Genitourinary: negative for dysuria or gross hematuria Musculoskeletal: negative for new gait disturbance or muscular weakness Integumentary: negative for new or persistent rashes Neurological: negative for TIA or stroke symptoms Psychiatric: negative for SI  or delusions Allergic/Immunologic: negative for hives  Objective  Vitals: BP (!) 90/56   Pulse (!) 166   Temp (!) 103.2 F (39.6 C) (Oral)   Resp 14   Ht 5\' 4"  (1.626 m)   Wt 129 lb (58.5 kg)   SpO2 99%   Breastfeeding Yes   BMI 22.14 kg/m  General: no acute respiratory distress but appears ill; lightheaded with ambulation Psych:  Alert and oriented, normal  mood and affect HEENT: Normocephalic, bilateral conjunctival injection,  nasal congestion present, TMs w/o erythema, OP with mild  erythema w/o exudate, no LAD, supple neck  Cardiovascular:  Sinus tach without murmur or gallop. no peripheral edema Respiratory:  Good breath sounds bilaterally, CTAB with normal respiratory effort Gastrointestinal: soft, flat abdomen, normal active bowel sounds, no palpable masses, no hepatosplenomegaly, no appreciated hernias Skin:  Warm, no rashes Neurologic:   Mental status is normal. normal gait  HR 130s increases to 160 with standing.  Fever of 103 - treated with advil.   Negative POC influenza A and B.  Lab Results  Component Value Date   WBC 6.1 03/21/2018   HGB 13.3 03/21/2018   HCT 39.5 03/21/2018   MCV 85.9 03/21/2018   PLT 230.0 03/21/2018     Commons side effects, risks, benefits, and alternatives for medications and treatment plan prescribed today were discussed, and the patient expressed understanding of the given instructions. Patient is instructed to call or message via MyChart if he/she has any questions or concerns regarding our treatment plan. No barriers to understanding were identified. We discussed Red Flag symptoms and signs in detail. Patient expressed understanding regarding what to do in case of urgent or emergency type symptoms.   Medication list was reconciled, printed and provided to the patient in AVS. Patient instructions and summary information was reviewed with the patient as documented in the AVS. This note was prepared with assistance of Dragon voice recognition software. Occasional wrong-word or sound-a-like substitutions may have occurred due to the inherent limitations of voice recognition software

## 2018-06-25 NOTE — Telephone Encounter (Signed)
  Pt called to say she has had sore throat that started this morning.  She has body aches and sinus drainage with cough. She is unsure about fever. She was looking for advice for OTC mediations because she is breast feeding. Pt was advised to seek information from pharmacist as to which OTC medications are ok to take while breast feeding.  She states her son goes to daycare and has had cold symptoms. Per protocol pt will go to urgent care today. No appointments available today. Pt refused other office. Care advice read to patient.  Pt verbalized understanding of all instructions. Reason for Disposition . [1] Sore throat is the only symptom AND [2] present > 48 hours  Answer Assessment - Initial Assessment Questions 1. ONSET: "When did the throat start hurting?" (Hours or days ago)      This morning 2. SEVERITY: "How bad is the sore throat?" (Scale 1-10; mild, moderate or severe)   - MILD (1-3):  doesn't interfere with eating or normal activities   - MODERATE (4-7): interferes with eating some solids and normal activities   - SEVERE (8-10):  excruciating pain, interferes with most normal activities   - SEVERE DYSPHAGIA: can't swallow liquids, drooling     mild 3. STREP EXPOSURE: "Has there been any exposure to strep within the past week?" If so, ask: "What type of contact occurred?"      Son has a cold 4.  VIRAL SYMPTOMS: "Are there any symptoms of a cold, such as a runny nose, cough, hoarse voice or red eyes?"      Runny nose, cough, 5. FEVER: "Do you have a fever?" If so, ask: "What is your temperature, how was it measured, and when did it start?"     Body aches 6. PUS ON THE TONSILS: "Is there pus on the tonsils in the back of your throat?"     no 7. OTHER SYMPTOMS: "Do you have any other symptoms?" (e.g., difficulty breathing, headache, rash)    headache 8. PREGNANCY: "Is there any chance you are pregnant?" "When was your last menstrual period?"   No  Protocols used: SORE  THROAT-A-AH

## 2018-06-25 NOTE — Telephone Encounter (Signed)
FYI

## 2018-06-25 NOTE — Patient Instructions (Addendum)
Because you are very dehydrated and have been vomiting, I recommend going directly to the [facility] on [address] for further evaluation and treatment. You may benefit from IV fluids and blood work.   Your flu test here in the office was negative.

## 2018-06-26 ENCOUNTER — Encounter: Payer: Self-pay | Admitting: Family Medicine

## 2018-06-26 ENCOUNTER — Ambulatory Visit (INDEPENDENT_AMBULATORY_CARE_PROVIDER_SITE_OTHER): Payer: Managed Care, Other (non HMO) | Admitting: Family Medicine

## 2018-06-26 ENCOUNTER — Telehealth: Payer: Self-pay | Admitting: *Deleted

## 2018-06-26 VITALS — BP 103/73 | HR 117 | Temp 99.8°F | Ht 64.0 in | Wt 132.4 lb

## 2018-06-26 DIAGNOSIS — R059 Cough, unspecified: Secondary | ICD-10-CM

## 2018-06-26 DIAGNOSIS — R509 Fever, unspecified: Secondary | ICD-10-CM

## 2018-06-26 DIAGNOSIS — J029 Acute pharyngitis, unspecified: Secondary | ICD-10-CM | POA: Diagnosis not present

## 2018-06-26 DIAGNOSIS — R05 Cough: Secondary | ICD-10-CM | POA: Diagnosis not present

## 2018-06-26 DIAGNOSIS — J329 Chronic sinusitis, unspecified: Secondary | ICD-10-CM | POA: Diagnosis not present

## 2018-06-26 LAB — POC URINALSYSI DIPSTICK (AUTOMATED)
Bilirubin, UA: NEGATIVE
Glucose, UA: NEGATIVE
Ketones, UA: NEGATIVE
LEUKOCYTES UA: NEGATIVE
Nitrite, UA: NEGATIVE
PH UA: 6 (ref 5.0–8.0)
Protein, UA: NEGATIVE
RBC UA: NEGATIVE
Spec Grav, UA: 1.02 (ref 1.010–1.025)
UROBILINOGEN UA: 0.2 U/dL

## 2018-06-26 LAB — POCT RAPID STREP A (OFFICE): Rapid Strep A Screen: NEGATIVE

## 2018-06-26 MED ORDER — IPRATROPIUM BROMIDE 0.06 % NA SOLN: 2.0000 | Freq: Four times a day (QID) | NASAL | 0 refills | Status: DC

## 2018-06-26 MED ORDER — AZITHROMYCIN 250 MG PO TABS: ORAL_TABLET | ORAL | 0 refills | Status: DC

## 2018-06-26 NOTE — Progress Notes (Signed)
   Chief Complaint:  Julie Gonzalez is a 33 y.o. female who presents for same day appointment with a chief complaint of fever.   Assessment/Plan:  Sinusitis / Sore Throat / cough Likely viral URI.  Rapid strep negative.  UA negative for signs of infection and no signs of dehydration.  Overall, vital signs are stable and within normal ranges today.  We will continue with conservative management.  Will start Atrovent nasal spray for rhinorrhea and sinusitis.  Encouraged good oral hydration over the next several days.  Recommended Tylenol and/or Motrin as needed for pain and fever.  We will send in a "pocket prescription" for azithromycin with strict instruction to not start unless symptoms worsen or not improve the next 2 days.  Discussed reasons to return to care and seek emergent care.  Follow-up as needed.     Subjective:  HPI:  Fever, acute problem Started yesterday.  Associated with myalgias, rhinorrhea, sore throat, headache, and fatigue.  She was seen yesterday at another clinic and was found to be febrile to 103 F and tachycardic with low blood pressure.  Patient was given ibuprofen and instructed to go to the ED.  She did not go to the ED however was able to push oral fluids.  Symptoms seem to be improving today.  Fever seems to be resolving.  Still has significant cough and runny nose.  No episodes of nausea or vomiting since yesterday.  No sick contacts.  No other treatments tried.  No other obvious alleviating or aggravating factors.   ROS: Per HPI  PMH: She reports that she has never smoked. She has never used smokeless tobacco. She reports that she does not drink alcohol or use drugs.      Objective:  Physical Exam: BP 103/73 (BP Location: Left Arm, Patient Position: Sitting, Cuff Size: Normal)   Pulse (!) 117   Temp 99.8 F (37.7 C) (Oral)   Ht 5\' 4"  (1.626 m)   Wt 132 lb 6.1 oz (60 kg)   LMP 05/30/2018 (Approximate)   SpO2 99%   BMI 22.72 kg/m   Gen: NAD, resting  comfortably HEENT: TMs clear.  OP slightly erythematous.  Nasal mucosa erythematous and boggy with copious amount of clear discharge. CV: Regular rate and rhythm with no murmurs appreciated Pulm: Normal work of breathing, clear to auscultation bilaterally with no crackles, wheezes, or rhonchi  Results for orders placed or performed in visit on 06/26/18 (from the past 24 hour(s))  POCT Urinalysis Dipstick (Automated)     Status: None   Collection Time: 06/26/18 10:24 AM  Result Value Ref Range   Color, UA Yellow    Clarity, UA Clear    Glucose, UA Negative Negative   Bilirubin, UA Negative    Ketones, UA Negative    Spec Grav, UA 1.020 1.010 - 1.025   Blood, UA Negative    pH, UA 6.0 5.0 - 8.0   Protein, UA Negative Negative   Urobilinogen, UA 0.2 0.2 or 1.0 E.U./dL   Nitrite, UA Negative    Leukocytes, UA Negative Negative  POCT rapid strep A     Status: None   Collection Time: 06/26/18 10:25 AM  Result Value Ref Range   Rapid Strep A Screen Negative Negative        Topacio Cella M. Jimmey Ralph, MD 06/26/2018 10:28 AM

## 2018-06-26 NOTE — Telephone Encounter (Signed)
Pt was seen today by Dr. Jimmey Ralph. Dr. Earlene Plater notified.

## 2018-06-26 NOTE — Patient Instructions (Signed)
Start the atrovent.  Start the zpack if your symptoms worsen or do not improve in a few days.  Please stay well hydrated.  You can take tylenol 1000mg  every 6-8 hours and/or motrin 600mg  every 6-8 hours as needed for low grade fever and pain.  Please let me know if your symptoms worsen or fail to improve.  Take care, Dr Jimmey Ralph

## 2018-06-29 ENCOUNTER — Other Ambulatory Visit: Payer: Self-pay | Admitting: Family Medicine

## 2018-06-29 MED ORDER — OSELTAMIVIR PHOSPHATE 75 MG PO CAPS: 75.0000 mg | ORAL_CAPSULE | Freq: Every day | ORAL | 0 refills | Status: DC

## 2018-06-29 NOTE — Progress Notes (Signed)
Mom still breast feeding. Discussed limited data on this, but considered safe. She would still like to take this.   Can you call mom and again discuss side effects and she may consider pumping dumping and watching baby closely. Baby is just going to get it's own dosage plus breast milk.   Orland Mustard, MD [facility]

## 2018-06-29 NOTE — Progress Notes (Signed)
Called and left voicemail message requesting a call back.  CRM created 

## 2018-11-18 ENCOUNTER — Encounter: Payer: Self-pay | Admitting: Family Medicine

## 2018-11-20 ENCOUNTER — Encounter: Payer: Self-pay | Admitting: Family Medicine

## 2018-11-21 ENCOUNTER — Ambulatory Visit: Payer: Managed Care, Other (non HMO) | Admitting: Family Medicine

## 2018-11-21 NOTE — Progress Notes (Deleted)
   Rubee Vega is a 33 y.o. female is here for follow up.  History of Present Illness:   (SCRIBE ATTESTATION)  HPI:   Health Maintenance Due  Topic Date Due  . TETANUS/TDAP  10/02/2002   Depression screen PHQ 2/9 03/17/2017 02/26/2017  Decreased Interest 0 0  Down, Depressed, Hopeless 0 0  PHQ - 2 Score 0 0   PMHx, SurgHx, SocialHx, FamHx, Medications, and Allergies were reviewed in the Visit Navigator and updated as appropriate.   Patient Active Problem List   Diagnosis Date Noted  . Difficulty of mother performing breastfeeding 03/22/2018  . Maternal anemia, with delivery 01/11/2018  . PP C/S (8/28) 01/10/2018   Social History   Tobacco Use  . Smoking status: Never Smoker  . Smokeless tobacco: Never Used  Substance Use Topics  . Alcohol use: No  . Drug use: No   Current Medications and Allergies   Current Outpatient Medications:  .  azithromycin (ZITHROMAX) 250 MG tablet, Take 2 tabs day 1, then 1 tab daily, Disp: 6 each, Rfl: 0 .  ipratropium (ATROVENT) 0.06 % nasal spray, Place 2 sprays into both nostrils 4 (four) times daily., Disp: 15 mL, Rfl: 0 .  oseltamivir (TAMIFLU) 75 MG capsule, Take 1 capsule (75 mg total) by mouth daily., Disp: 10 capsule, Rfl: 0  No Known Allergies Review of Systems   Pertinent items are noted in the HPI. Otherwise, a complete ROS is negative.  Vitals  There were no vitals filed for this visit.   There is no height or weight on file to calculate BMI.  Physical Exam   Physical Exam  Results for orders placed or performed in visit on 06/26/18  POCT rapid strep A  Result Value Ref Range   Rapid Strep A Screen Negative Negative  POCT Urinalysis Dipstick (Automated)  Result Value Ref Range   Color, UA Yellow    Clarity, UA Clear    Glucose, UA Negative Negative   Bilirubin, UA Negative    Ketones, UA Negative    Spec Grav, UA 1.020 1.010 - 1.025   Blood, UA Negative    pH, UA 6.0 5.0 - 8.0   Protein, UA Negative  Negative   Urobilinogen, UA 0.2 0.2 or 1.0 E.U./dL   Nitrite, UA Negative    Leukocytes, UA Negative Negative    Assessment and Plan   There are no diagnoses linked to this encounter.  . Orders and follow up as documented in [place], reviewed diet, exercise and weight control, cardiovascular risk and specific lipid/LDL goals reviewed, reviewed medications and side effects in detail.  . Reviewed expectations re: course of current medical issues. . Outlined signs and symptoms indicating need for more acute intervention. . Patient verbalized understanding and all questions were answered. . Patient received an After Visit Summary.  *** CMA served as Education administrator during this visit. History, Physical, and Plan performed by medical provider. The above documentation has been reviewed and is accurate and complete. Briscoe Deutscher, D.O.  Briscoe Deutscher, DO [place], [facility] 11/21/2018

## 2018-12-20 ENCOUNTER — Encounter: Payer: Self-pay | Admitting: Family Medicine

## 2021-04-21 ENCOUNTER — Ambulatory Visit: Payer: BC Managed Care – PPO | Admitting: Physician Assistant

## 2021-04-21 ENCOUNTER — Other Ambulatory Visit: Payer: Self-pay

## 2021-04-21 ENCOUNTER — Encounter: Payer: Self-pay | Admitting: Physician Assistant

## 2021-04-21 VITALS — BP 89/60 | HR 94 | Temp 97.2°F | Ht 64.0 in | Wt 112.2 lb

## 2021-04-21 DIAGNOSIS — Z0000 Encounter for general adult medical examination without abnormal findings: Secondary | ICD-10-CM

## 2021-04-21 DIAGNOSIS — Z131 Encounter for screening for diabetes mellitus: Secondary | ICD-10-CM

## 2021-04-21 DIAGNOSIS — Z13220 Encounter for screening for lipoid disorders: Secondary | ICD-10-CM

## 2021-04-21 NOTE — Progress Notes (Signed)
Subjective:    Patient ID: Laretha Luepke, female    DOB: [birth year], 35 y.o.   MRN: [id number]  Chief Complaint  Patient presents with   Annual Exam    HPI  35 y.o. patient presents today for new patient establishment with me.  Patient was previously established with Dr. Earlene Plater. She is here with husband today. She has an 34 and a 1 year old.   Current Care Team: No specialists.    Acute concerns: None   Health maintenance: Lifestyle/ exercise: No exercise Nutrition: Enjoys cooking  Mental health: None  Sleep: No issues Substance use: None Sexual activity: Monogamous  Immunizations: Declines flu vaccine Pap: Needs to be updated    Past Medical History:  Diagnosis Date   Maternal anemia, with delivery 01/11/2018   Status post repeat low transverse cesarean section [Z98.891] 01/10/2018    Past Surgical History:  Procedure Laterality Date   CESAREAN SECTION N/A 01/10/2018   Procedure: CESAREAN SECTION;  Surgeon: Shea Evans, MD;  Location: [facility] BIRTHING SUITES;  Service: Obstetrics;  Laterality: N/A;    Family History  Problem Relation Age of Onset   Diabetes Father    Heart disease Paternal Grandmother     Social History   Tobacco Use   Smoking status: Never   Smokeless tobacco: Never  Vaping Use   Vaping Use: Never used  Substance Use Topics   Alcohol use: No   Drug use: No     No Known Allergies  Review of Systems NEGATIVE UNLESS OTHERWISE INDICATED IN HPI      Objective:     BP (!) 89/60   Pulse 94   Temp (!) 97.2 F (36.2 C)   Ht 5\' 4"  (1.626 m)   Wt 112 lb 3.2 oz (50.9 kg)   LMP 04/16/2021   SpO2 98%   BMI 19.26 kg/m   Wt Readings from Last 3 Encounters:  04/21/21 112 lb 3.2 oz (50.9 kg)  06/26/18 132 lb 6.1 oz (60 kg)  06/25/18 129 lb (58.5 kg)    BP Readings from Last 3 Encounters:  04/21/21 (!) 89/60  06/26/18 103/73  06/25/18 (!) 90/56     Physical Exam Vitals and nursing note reviewed.  Constitutional:       Appearance: Normal appearance. She is normal weight. She is not toxic-appearing.  HENT:     Head: Normocephalic and atraumatic.     Right Ear: Tympanic membrane, ear canal and external ear normal.     Left Ear: Tympanic membrane, ear canal and external ear normal.     Nose: Nose normal.     Mouth/Throat:     Mouth: Mucous membranes are moist.  Eyes:     Extraocular Movements: Extraocular movements intact.     Conjunctiva/sclera: Conjunctivae normal.     Pupils: Pupils are equal, round, and reactive to light.  Cardiovascular:     Rate and Rhythm: Normal rate and regular rhythm.     Pulses: Normal pulses.     Heart sounds: Normal heart sounds.  Pulmonary:     Effort: Pulmonary effort is normal.     Breath sounds: Normal breath sounds.  Abdominal:     General: Abdomen is flat. Bowel sounds are normal.     Palpations: Abdomen is soft.  Musculoskeletal:        General: Normal range of motion.     Cervical back: Normal range of motion and neck supple.  Skin:    General: Skin is warm and dry.  Neurological:     General: No focal deficit present.     Mental Status: She is alert and oriented to person, place, and time.  Psychiatric:        Mood and Affect: Mood normal.        Behavior: Behavior normal.        Thought Content: Thought content normal.        Judgment: Judgment normal.       Assessment & Plan:   Problem List Items Addressed This Visit   None Visit Diagnoses     Encounter for annual physical exam    -  Primary   Relevant Orders   CBC with Differential/Platelet   Comprehensive metabolic panel   Lipid panel   Diabetes mellitus screening       Relevant Orders   Comprehensive metabolic panel   Screening for cholesterol level       Relevant Orders   Lipid panel      Plan: -Age-appropriate screening and counseling performed today. Will check labs and call with results. Preventive measures discussed and printed in AVS for patient. Encouraged to incorporate  weight lifting, cardio during the week. Drink plenty of fluids. F/up for well woman exam.    Carrina Schoenberger M Wess Baney, PA-C

## 2021-04-21 NOTE — Patient Instructions (Signed)
Please schedule for fasting labs tomorrow   Schedule for well woman exam in the new year   Call if any concerns

## 2021-04-22 ENCOUNTER — Other Ambulatory Visit (INDEPENDENT_AMBULATORY_CARE_PROVIDER_SITE_OTHER): Payer: BC Managed Care – PPO

## 2021-04-22 DIAGNOSIS — Z131 Encounter for screening for diabetes mellitus: Secondary | ICD-10-CM | POA: Diagnosis not present

## 2021-04-22 DIAGNOSIS — Z13220 Encounter for screening for lipoid disorders: Secondary | ICD-10-CM | POA: Diagnosis not present

## 2021-04-22 DIAGNOSIS — Z0000 Encounter for general adult medical examination without abnormal findings: Secondary | ICD-10-CM

## 2021-04-22 LAB — CBC WITH DIFFERENTIAL/PLATELET
Basophils Absolute: 0 10*3/uL (ref 0.0–0.1)
Basophils Relative: 0.9 % (ref 0.0–3.0)
Eosinophils Absolute: 0.1 10*3/uL (ref 0.0–0.7)
Eosinophils Relative: 1.2 % (ref 0.0–5.0)
HCT: 31.4 % — ABNORMAL LOW (ref 36.0–46.0)
Hemoglobin: 10.1 g/dL — ABNORMAL LOW (ref 12.0–15.0)
Lymphocytes Relative: 30.4 % (ref 12.0–46.0)
Lymphs Abs: 1.6 10*3/uL (ref 0.7–4.0)
MCHC: 32.3 g/dL (ref 30.0–36.0)
MCV: 74.4 fl — ABNORMAL LOW (ref 78.0–100.0)
Monocytes Absolute: 0.5 10*3/uL (ref 0.1–1.0)
Monocytes Relative: 9.2 % (ref 3.0–12.0)
Neutro Abs: 3.1 10*3/uL (ref 1.4–7.7)
Neutrophils Relative %: 58.3 % (ref 43.0–77.0)
Platelets: 224 10*3/uL (ref 150.0–400.0)
RBC: 4.22 Mil/uL (ref 3.87–5.11)
RDW: 14.7 % (ref 11.5–15.5)
WBC: 5.3 10*3/uL (ref 4.0–10.5)

## 2021-04-22 LAB — COMPREHENSIVE METABOLIC PANEL
ALT: 9 U/L (ref 0–35)
AST: 12 U/L (ref 0–37)
Albumin: 3.9 g/dL (ref 3.5–5.2)
Alkaline Phosphatase: 43 U/L (ref 39–117)
BUN: 10 mg/dL (ref 6–23)
CO2: 27 mEq/L (ref 19–32)
Calcium: 8.6 mg/dL (ref 8.4–10.5)
Chloride: 103 mEq/L (ref 96–112)
Creatinine, Ser: 0.63 mg/dL (ref 0.40–1.20)
GFR: 113.22 mL/min (ref >60.00)
Glucose, Bld: 86 mg/dL (ref 70–99)
Potassium: 3.7 mEq/L (ref 3.5–5.1)
Sodium: 135 mEq/L (ref 135–145)
Total Bilirubin: 0.4 mg/dL (ref 0.2–1.2)
Total Protein: 6.8 g/dL (ref 6.0–8.3)

## 2021-04-22 LAB — LIPID PANEL
Cholesterol: 170 mg/dL (ref 0–200)
HDL: 40.1 mg/dL (ref >39.00)
LDL Cholesterol: 116 mg/dL — ABNORMAL HIGH (ref 0–99)
NonHDL: 130.27
Total CHOL/HDL Ratio: 4
Triglycerides: 71 mg/dL (ref 0.0–149.0)
VLDL: 14.2 mg/dL (ref 0.0–40.0)

## 2021-04-29 ENCOUNTER — Other Ambulatory Visit: Payer: Self-pay | Admitting: Physician Assistant

## 2021-04-29 MED ORDER — IRON (FERROUS SULFATE) 325 (65 FE) MG PO TABS: ORAL_TABLET | ORAL | 2 refills | Status: AC

## 2022-02-07 ENCOUNTER — Encounter: Payer: Self-pay | Admitting: *Deleted

## 2022-04-28 ENCOUNTER — Encounter: Payer: Self-pay | Admitting: *Deleted
